# Patient Record
Sex: Female | Born: 1935 | Race: White | Hispanic: No | Marital: Married
Health system: Southern US, Community
[De-identification: ages and names within clinical notes are randomized; demographics above are authoritative.]

---

## 1998-12-01 ENCOUNTER — Ambulatory Visit (HOSPITAL_COMMUNITY): Admission: RE | Admit: 1998-12-01 | Discharge: 1998-12-01 | Payer: Self-pay | Admitting: Internal Medicine

## 1999-12-06 ENCOUNTER — Encounter: Payer: Self-pay | Admitting: Internal Medicine

## 1999-12-06 ENCOUNTER — Ambulatory Visit (HOSPITAL_COMMUNITY): Admission: RE | Admit: 1999-12-06 | Discharge: 1999-12-06 | Payer: Self-pay | Admitting: Internal Medicine

## 2001-01-22 ENCOUNTER — Encounter: Payer: Self-pay | Admitting: Oncology

## 2001-01-22 ENCOUNTER — Ambulatory Visit (HOSPITAL_COMMUNITY): Admission: RE | Admit: 2001-01-22 | Discharge: 2001-01-22 | Payer: Self-pay | Admitting: Internal Medicine

## 2004-11-08 ENCOUNTER — Inpatient Hospital Stay (HOSPITAL_COMMUNITY): Admission: RE | Admit: 2004-11-08 | Discharge: 2004-11-10 | Payer: Self-pay | Admitting: Orthopedic Surgery

## 2004-11-29 ENCOUNTER — Encounter: Payer: Self-pay | Admitting: Orthopedic Surgery

## 2004-12-19 ENCOUNTER — Encounter: Payer: Self-pay | Admitting: Orthopedic Surgery

## 2005-01-19 ENCOUNTER — Encounter: Payer: Self-pay | Admitting: Orthopedic Surgery

## 2005-06-08 ENCOUNTER — Ambulatory Visit: Payer: Self-pay | Admitting: Internal Medicine

## 2005-08-10 ENCOUNTER — Ambulatory Visit: Payer: Self-pay

## 2005-08-19 ENCOUNTER — Ambulatory Visit: Payer: Self-pay | Admitting: Internal Medicine

## 2005-11-15 ENCOUNTER — Encounter: Payer: Self-pay | Admitting: Orthopedic Surgery

## 2005-11-18 ENCOUNTER — Encounter: Payer: Self-pay | Admitting: Orthopedic Surgery

## 2006-04-05 ENCOUNTER — Inpatient Hospital Stay (HOSPITAL_COMMUNITY): Admission: RE | Admit: 2006-04-05 | Discharge: 2006-04-09 | Payer: Self-pay | Admitting: Orthopedic Surgery

## 2006-04-25 ENCOUNTER — Encounter: Payer: Self-pay | Admitting: Orthopedic Surgery

## 2006-05-19 ENCOUNTER — Encounter: Payer: Self-pay | Admitting: Orthopedic Surgery

## 2006-07-25 ENCOUNTER — Ambulatory Visit: Payer: Self-pay | Admitting: Internal Medicine

## 2007-04-24 ENCOUNTER — Ambulatory Visit: Payer: Self-pay | Admitting: Urology

## 2007-05-15 ENCOUNTER — Ambulatory Visit: Payer: Self-pay | Admitting: Urology

## 2007-10-17 ENCOUNTER — Ambulatory Visit: Payer: Self-pay | Admitting: Internal Medicine

## 2007-11-03 ENCOUNTER — Inpatient Hospital Stay: Payer: Self-pay | Admitting: Internal Medicine

## 2007-11-03 ENCOUNTER — Other Ambulatory Visit: Payer: Self-pay

## 2008-07-22 ENCOUNTER — Ambulatory Visit: Payer: Self-pay | Admitting: Ophthalmology

## 2008-07-22 ENCOUNTER — Other Ambulatory Visit: Payer: Self-pay

## 2008-07-26 IMAGING — CT CT HEAD WITHOUT CONTRAST
2 series · 16 of 30 positions shown, 20 images · non-contrast
Comparison: none

REASON FOR EXAM: severe headache
COMMENTS:

[Series 2: without · axial · non-contrast · 0.39mm/px · z∈[-170,-45]mm · 13 of 31 slices shown, 17 images]
[im 3/31  brain]
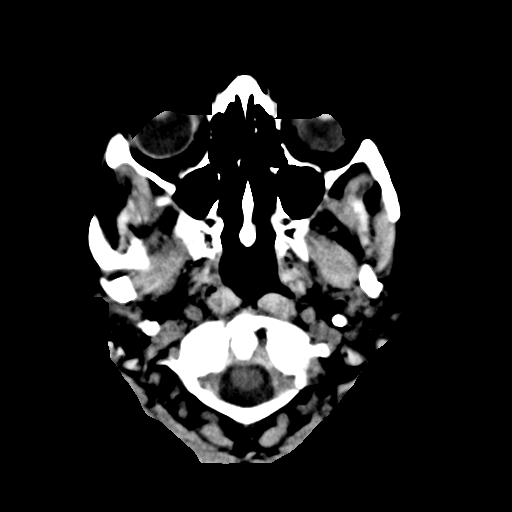
[im 3/31  bone]
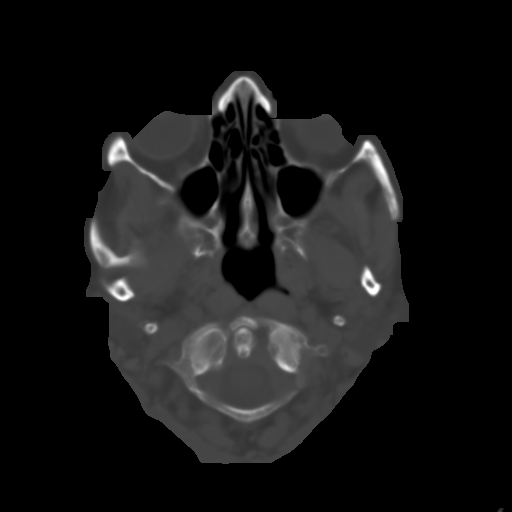
[im 5/31  brain]
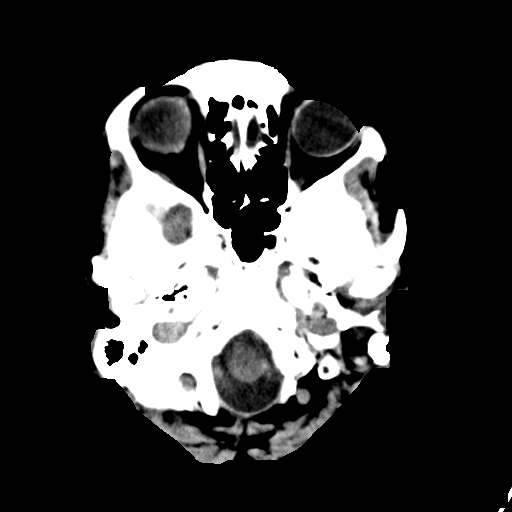
[im 7/31  brain]
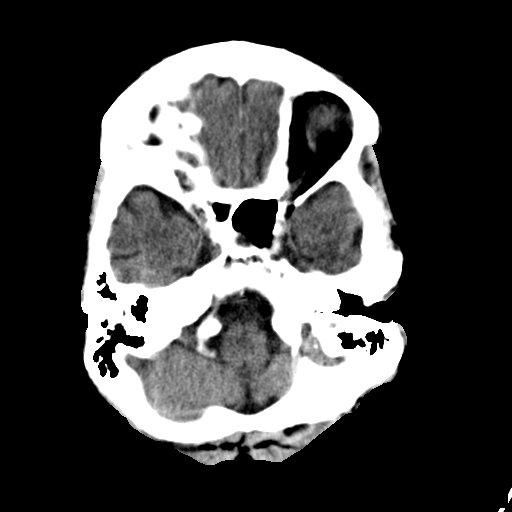
[im 9/31  brain]
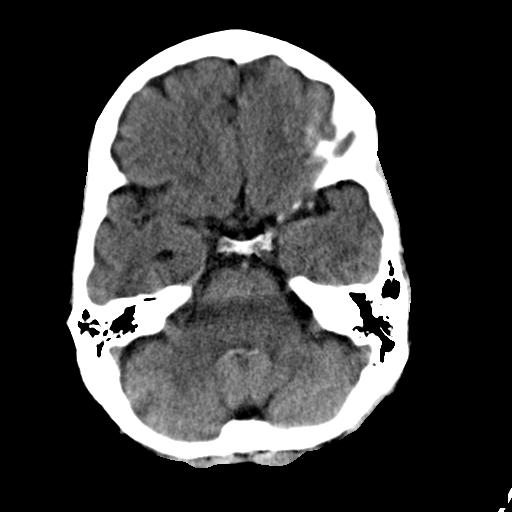
[im 11/31  brain]
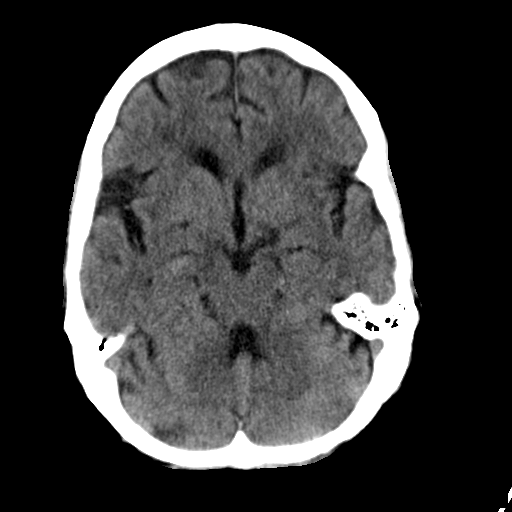
[im 11/31  bone]
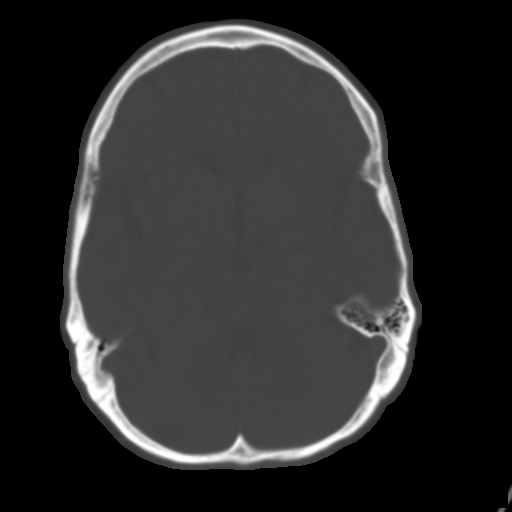
[im 13/31  brain]
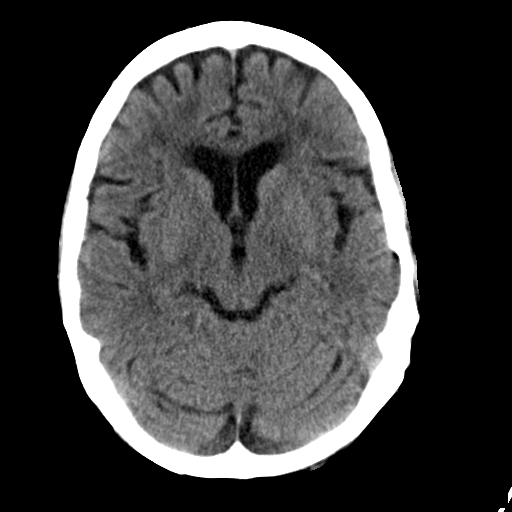
[im 16/31  brain]
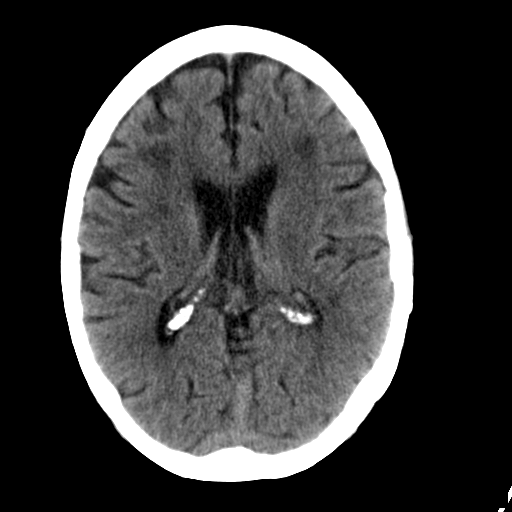
[im 18/31  brain]
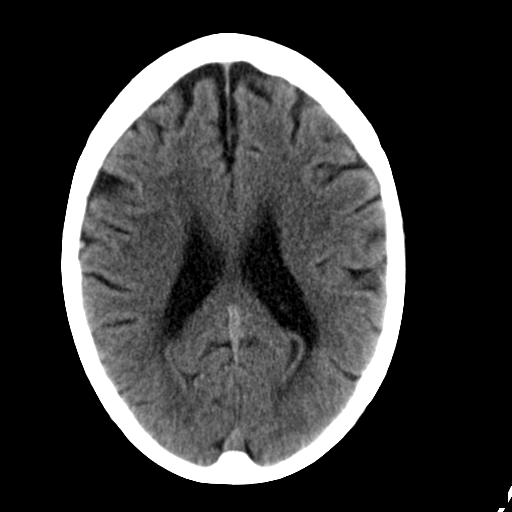
[im 20/31  brain]
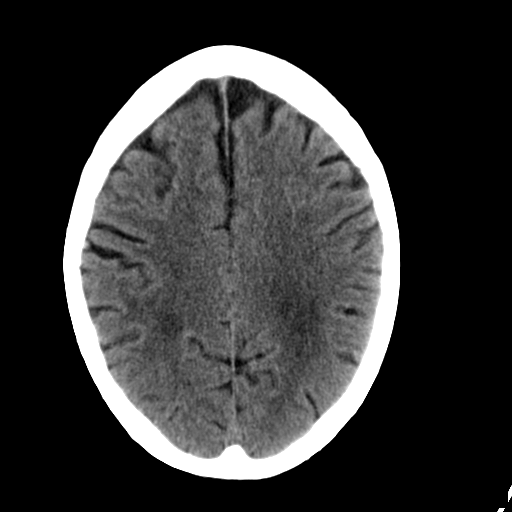
[im 20/31  bone]
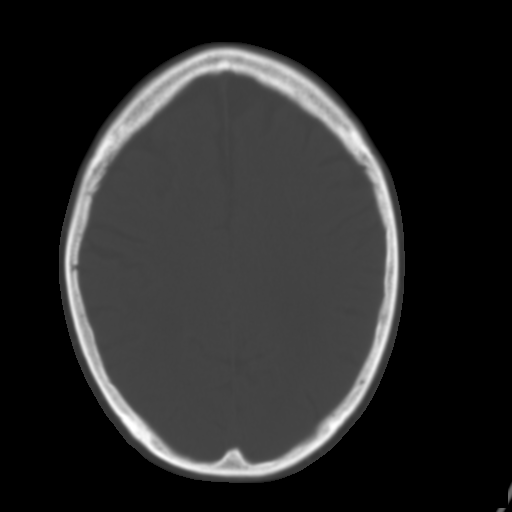
[im 22/31  brain]
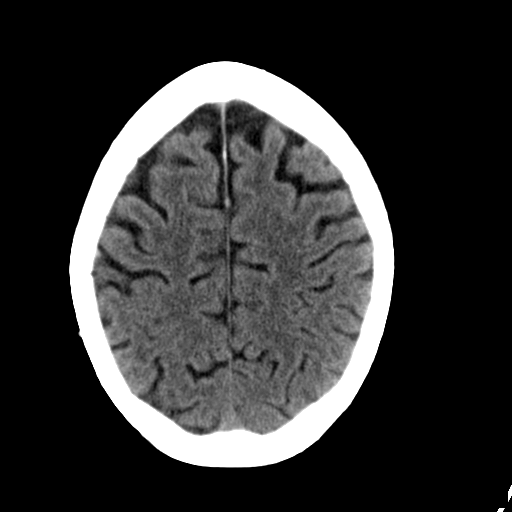
[im 24/31  brain]
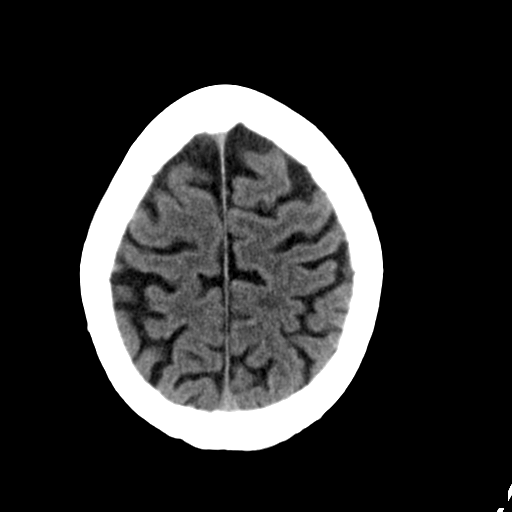
[im 26/31  brain]
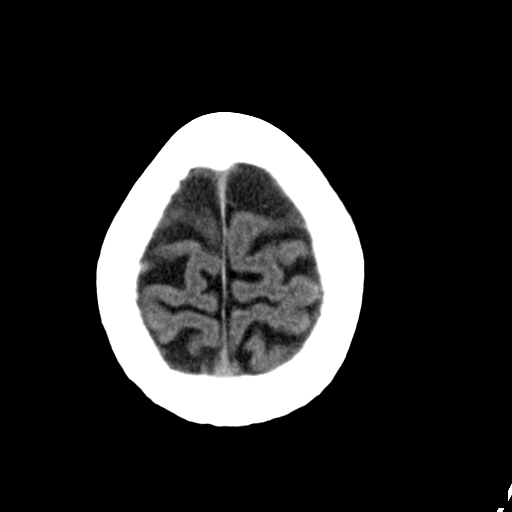
[im 28/31  brain]
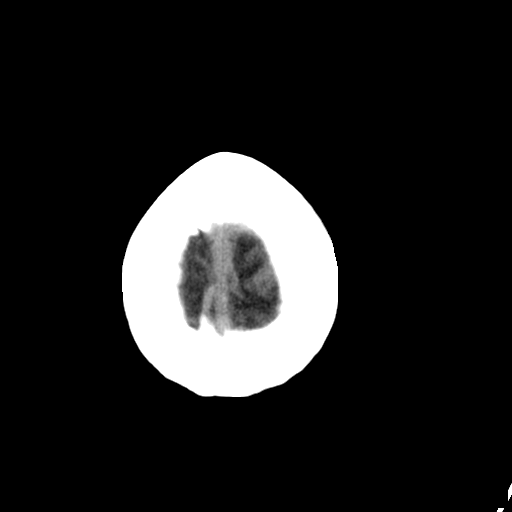
[im 28/31  bone]
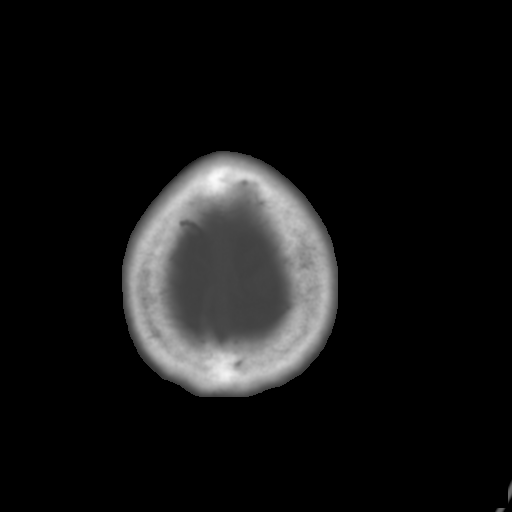

[Series 3: bone · axial · 0.39mm/px · z∈[-170,-130]mm · 3 of 31 slices shown]
[im 3/31  bone]
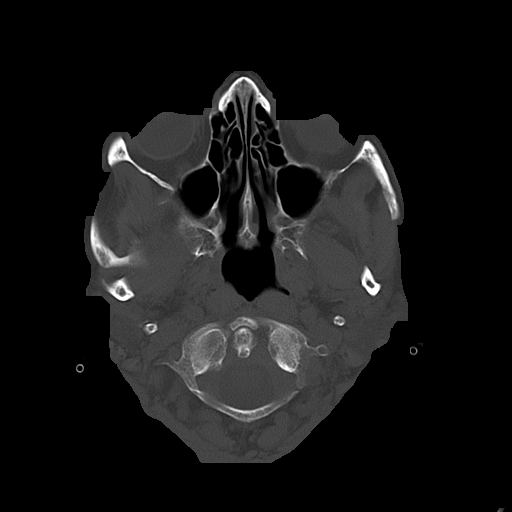
[im 7/31  bone]
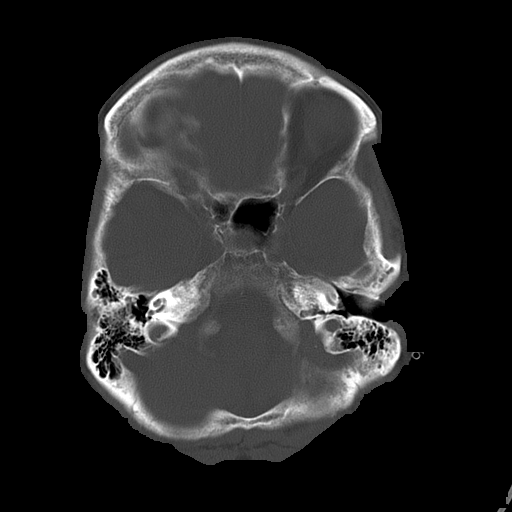
[im 11/31  bone]
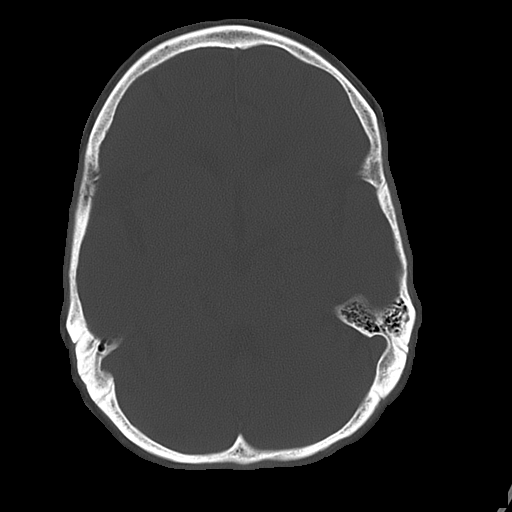

[16 of 30 positions shown; findings below may reference images not displayed]

PROCEDURE:     CT  - CT HEAD WITHOUT CONTRAST  - November 03, 2007  [DATE]

RESULT:     Noncontrast emergent CT of the brain is performed. The patient
has no prior exam for comparison. Images are reconstructed at 5 mm slice
thickness in the axial plane. There is some ill-defined low density within
the periventricular white matter which is nonspecific but suggestive of
chronic microvascular ischemic disease. There is no territorial infarct.
There is prominence of the ventricles and sulci consistent with atrophy
appropriate for the patient's age. The included sinuses and mastoids show
normal aeration although the frontal sinuses are aplastic. There is no skull
fracture.
IMPRESSION: 1. Changes of chronic microvascular ischemic disease. There is also
age-appropriate atrophy. There is no acute intracranial abnormality.

## 2008-07-28 ENCOUNTER — Ambulatory Visit: Payer: Self-pay | Admitting: Ophthalmology

## 2009-09-18 ENCOUNTER — Ambulatory Visit: Payer: Self-pay | Admitting: Oncology

## 2009-10-06 ENCOUNTER — Ambulatory Visit: Payer: Self-pay | Admitting: Internal Medicine

## 2009-10-15 ENCOUNTER — Ambulatory Visit: Payer: Self-pay | Admitting: Oncology

## 2009-10-19 ENCOUNTER — Ambulatory Visit: Payer: Self-pay | Admitting: Oncology

## 2009-11-18 ENCOUNTER — Ambulatory Visit: Payer: Self-pay | Admitting: Ophthalmology

## 2009-11-18 ENCOUNTER — Ambulatory Visit: Payer: Self-pay | Admitting: Oncology

## 2009-11-24 ENCOUNTER — Ambulatory Visit: Payer: Self-pay | Admitting: Ophthalmology

## 2010-07-14 IMAGING — CT CT CHEST-ABD W/ CM
1 of 2 series · 13 of 32 positions shown, 18 images · non-contrast
Comparison: none

REASON FOR EXAM: weight loss 783.21    HX breast CA  AE7.7   STAT READ
per office CALL  [phone]
COMMENTS:

[Series 2: soft tissue · axial · 0.59mm/px · z∈[-413,-63]mm · 13 of 80 slices shown, 18 images]
[im 5/80  mediastinal]
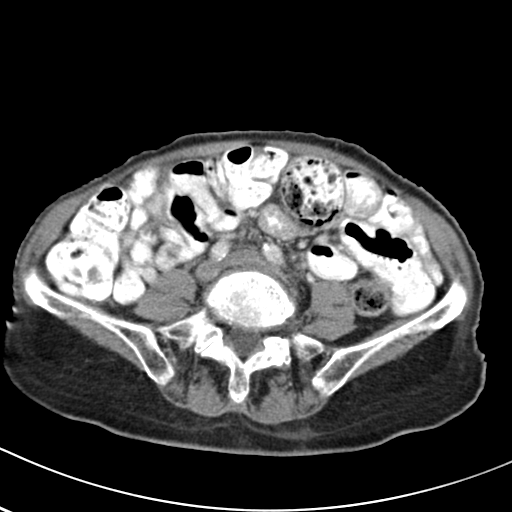
[im 5/80  bone]
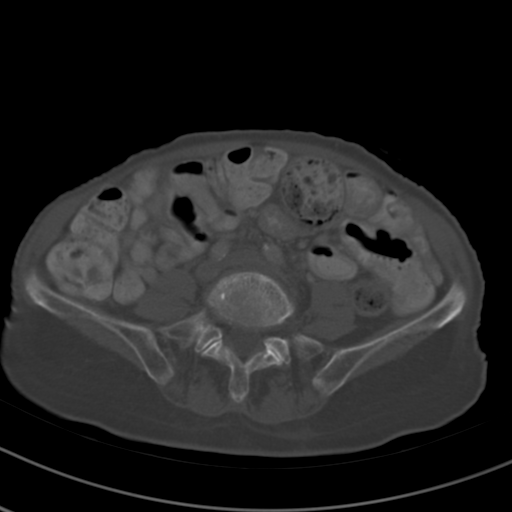
[im 15/80  mediastinal]
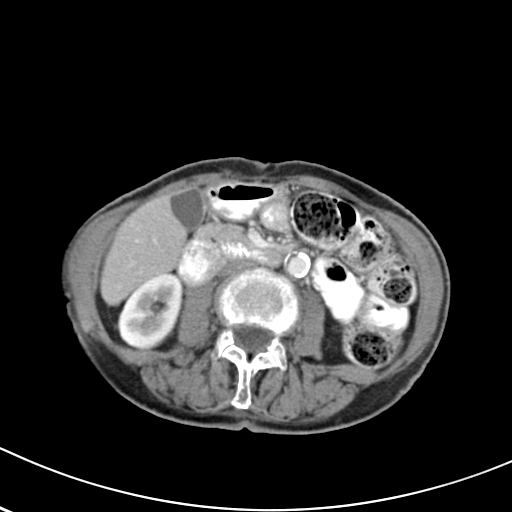
[im 20/80  mediastinal]
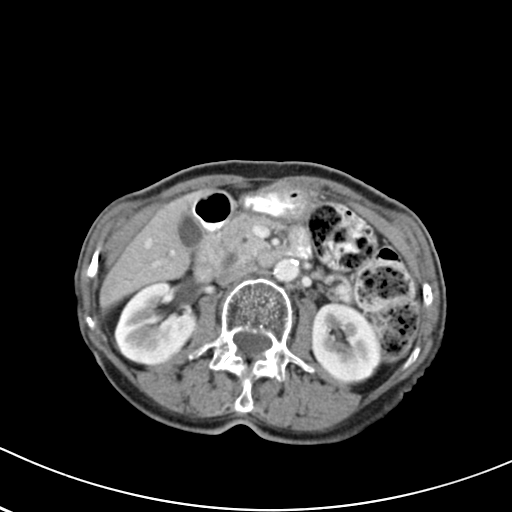
[im 27/80  mediastinal]
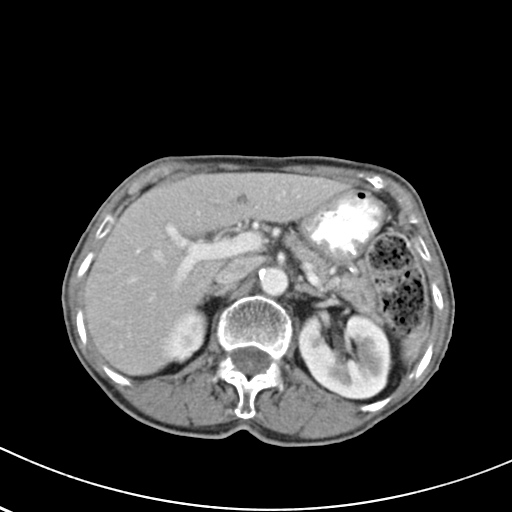
[im 30/80  mediastinal]
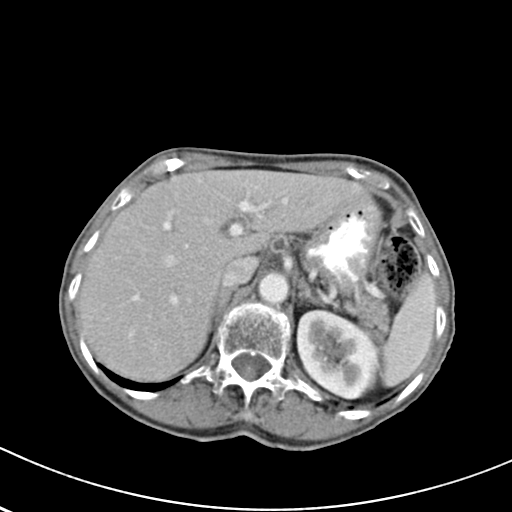
[im 39/80  mediastinal]
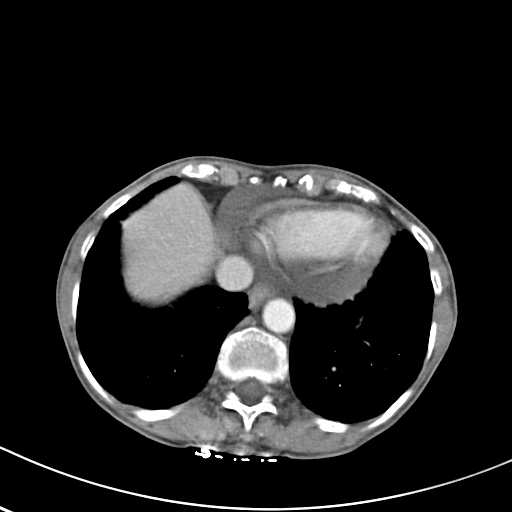
[im 40/80  mediastinal]
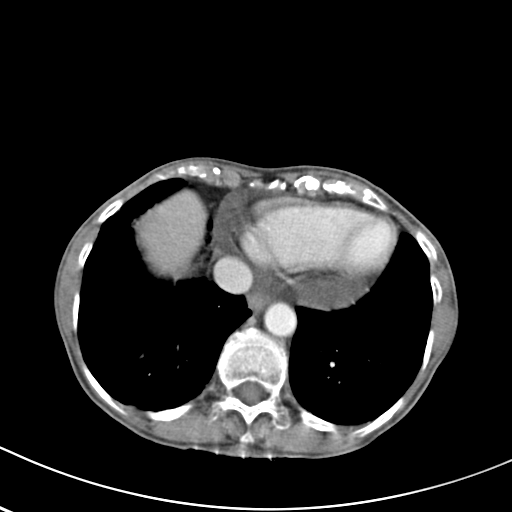
[im 50/80  mediastinal]
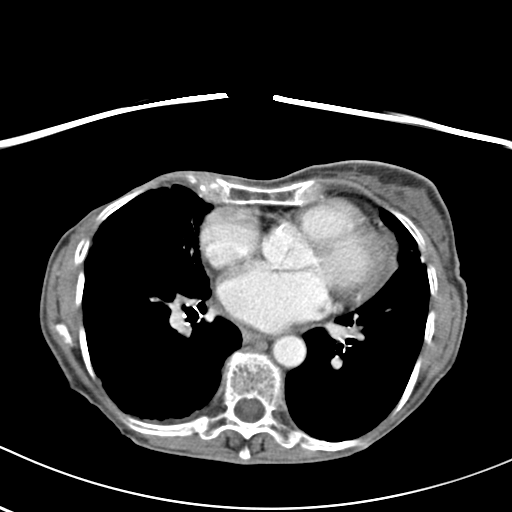
[im 53/80  mediastinal]
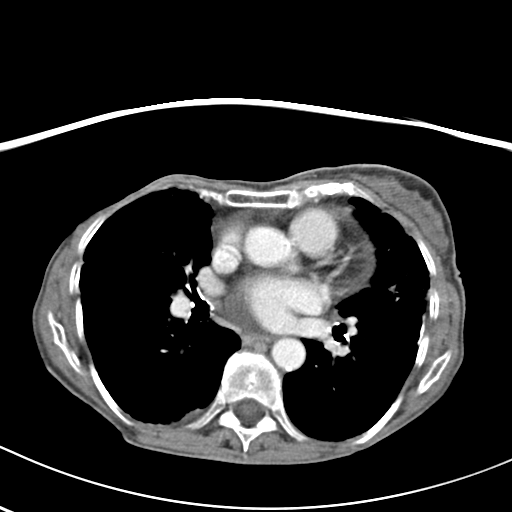
[im 53/80  bone]
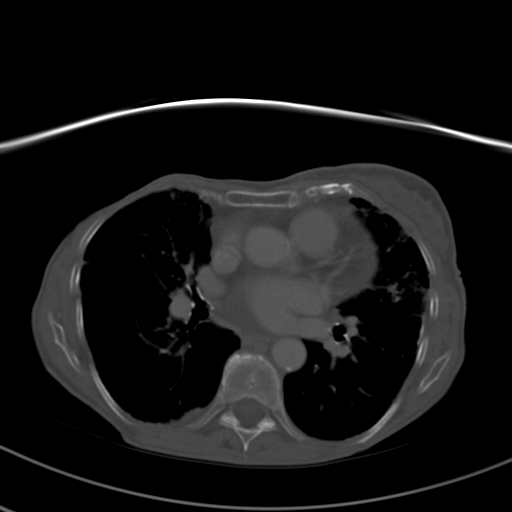
[im 60/80  mediastinal]
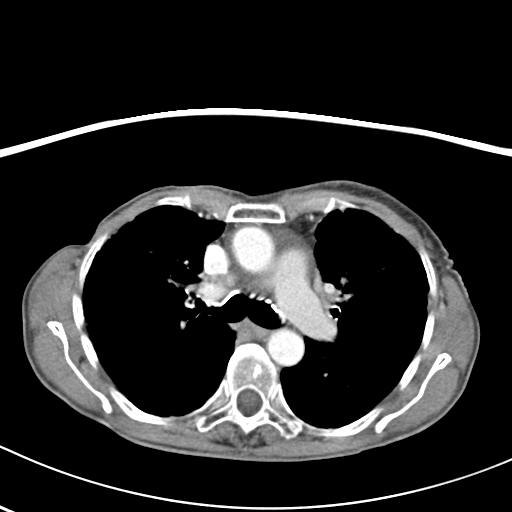
[im 60/80  lung]
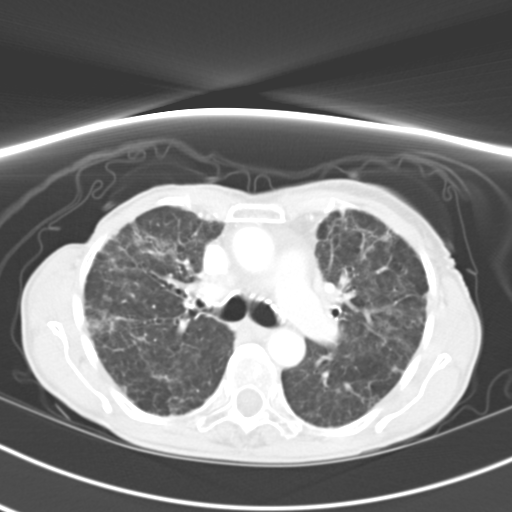
[im 65/80  mediastinal]
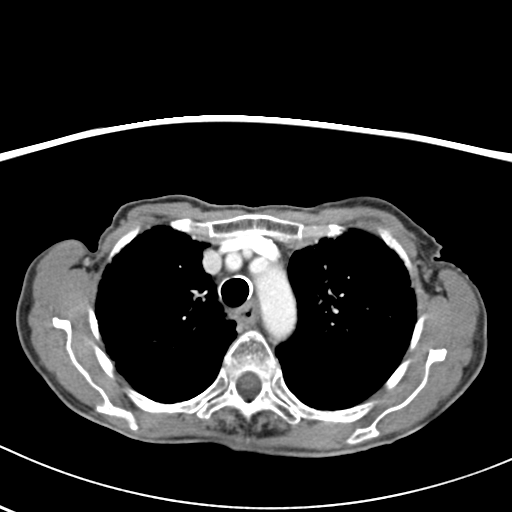
[im 65/80  lung]
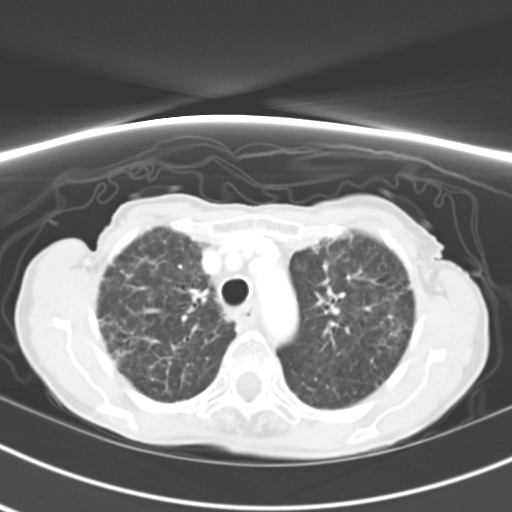
[im 70/80  lung]
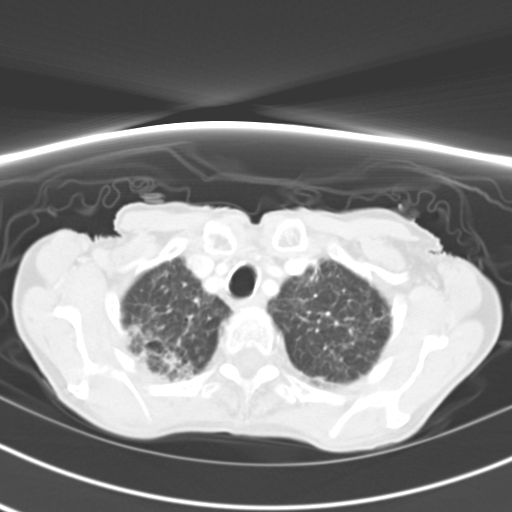
[im 75/80  mediastinal]
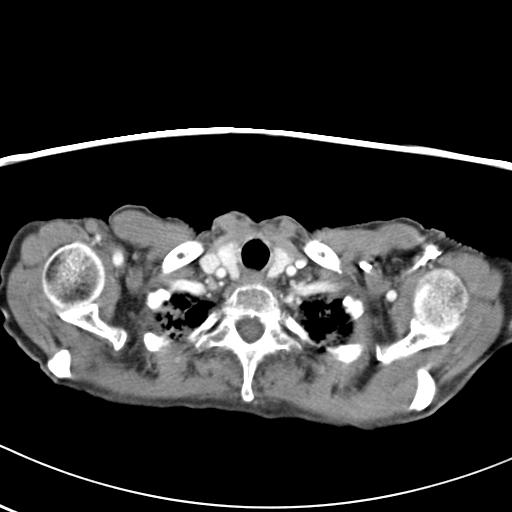
[im 75/80  lung]
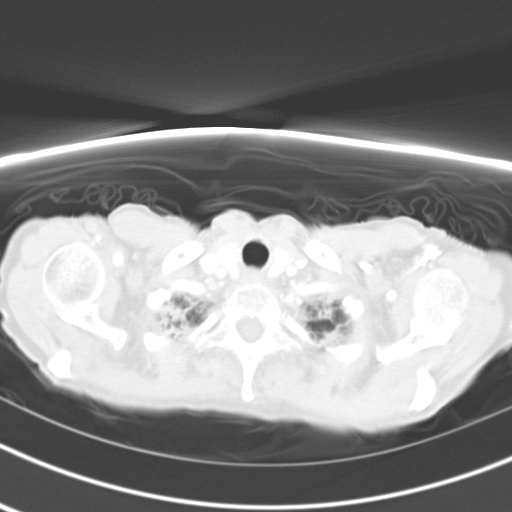

[13 of 32 positions shown; findings below may reference images not displayed]

PROCEDURE:     CT  - CT CHEST AND ABDOMEN W  - October 21, 2009 [DATE]

RESULT:     Comparison is made to prior study dated 11/03/07.

CHEST CT: Helical 5 mm sections were obtained from the thoracic inlet
through the lung bases status post intravenous administration of 70 ml
Isovue 300.

Evaluation of the mediastinum and hilar regions and structures demonstrates
no evidence of mediastinal nor hilar adenopathy nor masses. The previously
described subcarinal adenopathy has decreased in size and conspicuity. A
stable small pericardial effusion is appreciated. Evaluation of the lung
parenchyma demonstrates diffuse interstitial changes throughout both lungs.
Patchy areas of increased density are scattered throughout the right and
left hemithoraces. The previously described consolidative density within the
lateral right lung base as well as posteriorly has near completely resolved
in the interim with minimal areas of residual scarring. There is no evidence
of a pleural effusion.

ABDOMEN: Helical 5 mm sections were obtained from the lung bases through the
superior iliac crest status post intravenous administration of 70 ml Isovue
300 and oral contrast.

The liver, spleen, adrenals, pancreas and kidneys are unremarkable. There is
no CT evidence of bowel obstruction nor secondary signs reflecting
enteritis, colitis or diverticulitis. There is no evidence of abdominal
masses, free fluid, drainable loculated fluid collections or adenopathy nor
evidence of an abdominal aortic aneurysm.
IMPRESSION: 1. Near-complete resolution of the density within the right lower lobe.
2. Diffuse interstitial changes throughout both lungs with patchy areas of
increased focality as described above. These areas may represent areas of
focal scarring though infiltrates if of clinical concern cannot be excluded.
3. Residual small pericardial effusion within the base of the pericardium.
4. Interval decreased size and conspicuity of the subcarinal adenopathy.
5. No new masses or adenopathy is identified.
6. The chest wall was evaluated. There does not appear to be evidence of
masses in the region of the left breast.

## 2010-10-14 ENCOUNTER — Ambulatory Visit: Payer: Self-pay | Admitting: Internal Medicine

## 2011-04-11 ENCOUNTER — Ambulatory Visit: Payer: Self-pay

## 2011-04-11 ENCOUNTER — Ambulatory Visit: Payer: Self-pay | Admitting: Internal Medicine

## 2011-04-18 ENCOUNTER — Ambulatory Visit: Payer: Self-pay | Admitting: Unknown Physician Specialty

## 2011-04-19 ENCOUNTER — Ambulatory Visit: Payer: Self-pay | Admitting: Unknown Physician Specialty

## 2011-04-21 LAB — PATHOLOGY REPORT

## 2011-09-09 ENCOUNTER — Ambulatory Visit: Payer: Self-pay | Admitting: Internal Medicine

## 2011-11-01 ENCOUNTER — Ambulatory Visit: Payer: Self-pay | Admitting: Internal Medicine

## 2012-01-18 ENCOUNTER — Ambulatory Visit: Payer: Self-pay | Admitting: Rheumatology

## 2012-01-23 ENCOUNTER — Ambulatory Visit: Payer: Self-pay | Admitting: Neurology

## 2012-11-27 ENCOUNTER — Ambulatory Visit: Payer: Self-pay | Admitting: Internal Medicine

## 2013-04-10 ENCOUNTER — Inpatient Hospital Stay: Payer: Self-pay | Admitting: Internal Medicine

## 2013-04-10 LAB — CBC
HCT: 40 % (ref 35.0–47.0)
HGB: 13.6 g/dL (ref 12.0–16.0)
MCHC: 34 g/dL (ref 32.0–36.0)
MCV: 95 fL (ref 80–100)
RBC: 4.19 10*6/uL (ref 3.80–5.20)
WBC: 9.8 10*3/uL (ref 3.6–11.0)

## 2013-04-10 LAB — COMPREHENSIVE METABOLIC PANEL
Alkaline Phosphatase: 97 U/L (ref 50–136)
BUN: 12 mg/dL (ref 7–18)
Bilirubin,Total: 0.5 mg/dL (ref 0.2–1.0)
Calcium, Total: 8.8 mg/dL (ref 8.5–10.1)
Chloride: 95 mmol/L — ABNORMAL LOW (ref 98–107)
Co2: 31 mmol/L (ref 21–32)
Creatinine: 0.61 mg/dL (ref 0.60–1.30)
EGFR (Non-African Amer.): >60
SGOT(AST): 25 U/L (ref 15–37)
SGPT (ALT): 20 U/L (ref 12–78)
Sodium: 130 mmol/L — ABNORMAL LOW (ref 136–145)

## 2013-04-10 LAB — PRO B NATRIURETIC PEPTIDE: B-Type Natriuretic Peptide: 185 pg/mL (ref 0–450)

## 2013-04-10 LAB — TROPONIN I: Troponin-I: <0.02 ng/mL

## 2013-04-10 LAB — CK TOTAL AND CKMB (NOT AT ARMC): CK, Total: 73 U/L (ref 21–215)

## 2013-04-11 LAB — BASIC METABOLIC PANEL
Anion Gap: 4 — ABNORMAL LOW (ref 7–16)
BUN: 8 mg/dL (ref 7–18)
Calcium, Total: 8.3 mg/dL — ABNORMAL LOW (ref 8.5–10.1)
Chloride: 93 mmol/L — ABNORMAL LOW (ref 98–107)
Co2: 29 mmol/L (ref 21–32)
Creatinine: 0.53 mg/dL — ABNORMAL LOW (ref 0.60–1.30)
EGFR (African American): >60
Osmolality: 252 (ref 275–301)
Potassium: 3.6 mmol/L (ref 3.5–5.1)
Sodium: 126 mmol/L — ABNORMAL LOW (ref 136–145)

## 2013-04-11 LAB — CBC WITH DIFFERENTIAL/PLATELET
Basophil %: 0.3 %
Eosinophil #: 0 10*3/uL (ref 0.0–0.7)
Eosinophil %: 0.1 %
HCT: 36.9 % (ref 35.0–47.0)
HGB: 12.7 g/dL (ref 12.0–16.0)
Lymphocyte %: 7.8 %
MCH: 32.9 pg (ref 26.0–34.0)
MCV: 95 fL (ref 80–100)
Monocyte %: 8.2 %
Neutrophil #: 7.6 10*3/uL — ABNORMAL HIGH (ref 1.4–6.5)
Platelet: 192 10*3/uL (ref 150–440)
RBC: 3.87 10*6/uL (ref 3.80–5.20)

## 2013-04-11 LAB — MAGNESIUM: Magnesium: 1.3 mg/dL — ABNORMAL LOW

## 2013-04-12 LAB — URINALYSIS, COMPLETE
Bilirubin,UR: NEGATIVE
Glucose,UR: 50 mg/dL (ref 0–75)
Ph: 7 (ref 4.5–8.0)
Protein: NEGATIVE
Specific Gravity: 1.01 (ref 1.003–1.030)

## 2013-04-12 LAB — BASIC METABOLIC PANEL
Calcium, Total: 7.8 mg/dL — ABNORMAL LOW (ref 8.5–10.1)
Chloride: 93 mmol/L — ABNORMAL LOW (ref 98–107)
Co2: 31 mmol/L (ref 21–32)
EGFR (African American): >60
Osmolality: 259 (ref 275–301)
Potassium: 2.9 mmol/L — ABNORMAL LOW (ref 3.5–5.1)
Sodium: 130 mmol/L — ABNORMAL LOW (ref 136–145)

## 2013-04-12 LAB — POTASSIUM: Potassium: 3.8 mmol/L (ref 3.5–5.1)

## 2013-04-13 LAB — BASIC METABOLIC PANEL
BUN: 5 mg/dL — ABNORMAL LOW (ref 7–18)
Chloride: 91 mmol/L — ABNORMAL LOW (ref 98–107)
EGFR (African American): >60
EGFR (Non-African Amer.): >60
Glucose: 105 mg/dL — ABNORMAL HIGH (ref 65–99)
Osmolality: 257 (ref 275–301)

## 2013-04-13 LAB — URINE CULTURE

## 2013-04-14 LAB — BASIC METABOLIC PANEL
BUN: 7 mg/dL (ref 7–18)
Co2: 34 mmol/L — ABNORMAL HIGH (ref 21–32)
Sodium: 126 mmol/L — ABNORMAL LOW (ref 136–145)

## 2013-04-14 LAB — TSH: Thyroid Stimulating Horm: 0.928 u[IU]/mL

## 2013-04-15 LAB — BASIC METABOLIC PANEL
Anion Gap: 6 — ABNORMAL LOW (ref 7–16)
BUN: 11 mg/dL (ref 7–18)
Calcium, Total: 8.3 mg/dL — ABNORMAL LOW (ref 8.5–10.1)
Chloride: 85 mmol/L — ABNORMAL LOW (ref 98–107)
Creatinine: 0.26 mg/dL — ABNORMAL LOW (ref 0.60–1.30)
EGFR (Non-African Amer.): >60
Osmolality: 250 (ref 275–301)
Potassium: 3.7 mmol/L (ref 3.5–5.1)
Sodium: 124 mmol/L — ABNORMAL LOW (ref 136–145)

## 2013-04-15 LAB — PLATELET COUNT: Platelet: 208 10*3/uL (ref 150–440)

## 2013-04-16 LAB — CBC WITH DIFFERENTIAL/PLATELET
Basophil %: 0 %
Eosinophil #: 0 10*3/uL (ref 0.0–0.7)
Eosinophil %: 0 %
HCT: 34.6 % — ABNORMAL LOW (ref 35.0–47.0)
HGB: 12.3 g/dL (ref 12.0–16.0)
Lymphocyte #: 0.3 10*3/uL — ABNORMAL LOW (ref 1.0–3.6)
MCH: 33.1 pg (ref 26.0–34.0)
Monocyte #: 0.3 x10 3/mm (ref 0.2–0.9)
Monocyte %: 5.8 %
Neutrophil #: 5.2 10*3/uL (ref 1.4–6.5)
RDW: 12 % (ref 11.5–14.5)

## 2013-04-16 LAB — BASIC METABOLIC PANEL
Anion Gap: 4 — ABNORMAL LOW (ref 7–16)
Calcium, Total: 8.4 mg/dL — ABNORMAL LOW (ref 8.5–10.1)
Chloride: 89 mmol/L — ABNORMAL LOW (ref 98–107)
Co2: 34 mmol/L — ABNORMAL HIGH (ref 21–32)
Creatinine: 0.32 mg/dL — ABNORMAL LOW (ref 0.60–1.30)
EGFR (African American): >60
Glucose: 123 mg/dL — ABNORMAL HIGH (ref 65–99)
Osmolality: 257 (ref 275–301)
Potassium: 3.5 mmol/L (ref 3.5–5.1)
Sodium: 127 mmol/L — ABNORMAL LOW (ref 136–145)

## 2013-04-16 LAB — PATHOLOGY REPORT

## 2013-06-22 ENCOUNTER — Emergency Department: Payer: Self-pay | Admitting: Emergency Medicine

## 2013-06-22 LAB — CBC
HCT: 41.9 % (ref 35.0–47.0)
HGB: 14.2 g/dL (ref 12.0–16.0)
MCH: 32.3 pg (ref 26.0–34.0)
MCHC: 34 g/dL (ref 32.0–36.0)
MCV: 95 fL (ref 80–100)
Platelet: 225 10*3/uL (ref 150–440)
RBC: 4.42 10*6/uL (ref 3.80–5.20)
RDW: 14.7 % — ABNORMAL HIGH (ref 11.5–14.5)
WBC: 7.9 10*3/uL (ref 3.6–11.0)

## 2013-06-22 LAB — URINALYSIS, COMPLETE
Bilirubin,UR: NEGATIVE
Blood: NEGATIVE
Glucose,UR: NEGATIVE mg/dL (ref 0–75)
Nitrite: POSITIVE
Ph: 6 (ref 4.5–8.0)
Protein: 30
RBC,UR: 1 /HPF (ref 0–5)
Specific Gravity: 1.02 (ref 1.003–1.030)
Squamous Epithelial: <1
WBC UR: 56 /HPF (ref 0–5)

## 2013-06-22 LAB — BASIC METABOLIC PANEL
BUN: 19 mg/dL — ABNORMAL HIGH (ref 7–18)
Calcium, Total: 10.1 mg/dL (ref 8.5–10.1)
Co2: 27 mmol/L (ref 21–32)
EGFR (Non-African Amer.): 53 — ABNORMAL LOW
Glucose: 88 mg/dL (ref 65–99)
Sodium: 134 mmol/L — ABNORMAL LOW (ref 136–145)

## 2013-09-17 ENCOUNTER — Ambulatory Visit: Payer: Self-pay

## 2014-04-30 ENCOUNTER — Ambulatory Visit: Payer: Self-pay | Admitting: Internal Medicine

## 2014-08-27 ENCOUNTER — Telehealth: Payer: Self-pay | Admitting: *Deleted

## 2014-08-27 NOTE — Telephone Encounter (Signed)
error 

## 2014-12-27 DIAGNOSIS — J449 Chronic obstructive pulmonary disease, unspecified: Secondary | ICD-10-CM | POA: Diagnosis not present

## 2015-01-27 DIAGNOSIS — J449 Chronic obstructive pulmonary disease, unspecified: Secondary | ICD-10-CM | POA: Diagnosis not present

## 2015-03-09 DIAGNOSIS — D62 Acute posthemorrhagic anemia: Secondary | ICD-10-CM | POA: Diagnosis not present

## 2015-03-09 DIAGNOSIS — M810 Age-related osteoporosis without current pathological fracture: Secondary | ICD-10-CM | POA: Diagnosis not present

## 2015-03-09 DIAGNOSIS — S72002A Fracture of unspecified part of neck of left femur, initial encounter for closed fracture: Secondary | ICD-10-CM | POA: Diagnosis not present

## 2015-03-09 DIAGNOSIS — S0990XA Unspecified injury of head, initial encounter: Secondary | ICD-10-CM | POA: Diagnosis not present

## 2015-03-09 DIAGNOSIS — Z853 Personal history of malignant neoplasm of breast: Secondary | ICD-10-CM | POA: Diagnosis not present

## 2015-03-09 DIAGNOSIS — K219 Gastro-esophageal reflux disease without esophagitis: Secondary | ICD-10-CM | POA: Diagnosis not present

## 2015-03-09 DIAGNOSIS — J849 Interstitial pulmonary disease, unspecified: Secondary | ICD-10-CM | POA: Diagnosis not present

## 2015-03-09 DIAGNOSIS — R1312 Dysphagia, oropharyngeal phase: Secondary | ICD-10-CM | POA: Diagnosis not present

## 2015-03-09 DIAGNOSIS — R410 Disorientation, unspecified: Secondary | ICD-10-CM | POA: Diagnosis not present

## 2015-03-09 DIAGNOSIS — S72009A Fracture of unspecified part of neck of unspecified femur, initial encounter for closed fracture: Secondary | ICD-10-CM | POA: Diagnosis not present

## 2015-03-09 DIAGNOSIS — Z9011 Acquired absence of right breast and nipple: Secondary | ICD-10-CM | POA: Diagnosis not present

## 2015-03-09 DIAGNOSIS — J9610 Chronic respiratory failure, unspecified whether with hypoxia or hypercapnia: Secondary | ICD-10-CM | POA: Diagnosis not present

## 2015-03-09 DIAGNOSIS — R279 Unspecified lack of coordination: Secondary | ICD-10-CM | POA: Diagnosis not present

## 2015-03-09 DIAGNOSIS — J984 Other disorders of lung: Secondary | ICD-10-CM | POA: Diagnosis not present

## 2015-03-09 DIAGNOSIS — R4182 Altered mental status, unspecified: Secondary | ICD-10-CM | POA: Diagnosis not present

## 2015-03-09 DIAGNOSIS — Z9841 Cataract extraction status, right eye: Secondary | ICD-10-CM | POA: Diagnosis not present

## 2015-03-09 DIAGNOSIS — R40241 Glasgow coma scale score 13-15: Secondary | ICD-10-CM | POA: Diagnosis not present

## 2015-03-09 DIAGNOSIS — N390 Urinary tract infection, site not specified: Secondary | ICD-10-CM | POA: Diagnosis not present

## 2015-03-09 DIAGNOSIS — M1990 Unspecified osteoarthritis, unspecified site: Secondary | ICD-10-CM | POA: Diagnosis not present

## 2015-03-09 DIAGNOSIS — I10 Essential (primary) hypertension: Secondary | ICD-10-CM | POA: Diagnosis not present

## 2015-03-09 DIAGNOSIS — R262 Difficulty in walking, not elsewhere classified: Secondary | ICD-10-CM | POA: Diagnosis not present

## 2015-03-09 DIAGNOSIS — R41841 Cognitive communication deficit: Secondary | ICD-10-CM | POA: Diagnosis not present

## 2015-03-09 DIAGNOSIS — W19XXXD Unspecified fall, subsequent encounter: Secondary | ICD-10-CM | POA: Diagnosis not present

## 2015-03-09 DIAGNOSIS — J439 Emphysema, unspecified: Secondary | ICD-10-CM | POA: Diagnosis not present

## 2015-03-09 DIAGNOSIS — S72002D Fracture of unspecified part of neck of left femur, subsequent encounter for closed fracture with routine healing: Secondary | ICD-10-CM | POA: Diagnosis not present

## 2015-03-09 DIAGNOSIS — Z9221 Personal history of antineoplastic chemotherapy: Secondary | ICD-10-CM | POA: Diagnosis not present

## 2015-03-09 DIAGNOSIS — M25552 Pain in left hip: Secondary | ICD-10-CM | POA: Diagnosis not present

## 2015-03-09 DIAGNOSIS — R52 Pain, unspecified: Secondary | ICD-10-CM | POA: Diagnosis not present

## 2015-03-09 DIAGNOSIS — K5900 Constipation, unspecified: Secondary | ICD-10-CM | POA: Diagnosis not present

## 2015-03-09 DIAGNOSIS — W19XXXA Unspecified fall, initial encounter: Secondary | ICD-10-CM | POA: Diagnosis not present

## 2015-03-09 DIAGNOSIS — F0390 Unspecified dementia without behavioral disturbance: Secondary | ICD-10-CM | POA: Diagnosis not present

## 2015-03-09 DIAGNOSIS — S72142A Displaced intertrochanteric fracture of left femur, initial encounter for closed fracture: Secondary | ICD-10-CM | POA: Diagnosis not present

## 2015-03-09 DIAGNOSIS — F419 Anxiety disorder, unspecified: Secondary | ICD-10-CM | POA: Diagnosis not present

## 2015-03-09 DIAGNOSIS — Z9842 Cataract extraction status, left eye: Secondary | ICD-10-CM | POA: Diagnosis not present

## 2015-03-09 DIAGNOSIS — R5381 Other malaise: Secondary | ICD-10-CM | POA: Diagnosis not present

## 2015-03-09 DIAGNOSIS — F339 Major depressive disorder, recurrent, unspecified: Secondary | ICD-10-CM | POA: Diagnosis not present

## 2015-03-09 DIAGNOSIS — Z66 Do not resuscitate: Secondary | ICD-10-CM | POA: Diagnosis not present

## 2015-03-09 DIAGNOSIS — R0902 Hypoxemia: Secondary | ICD-10-CM | POA: Diagnosis not present

## 2015-03-09 DIAGNOSIS — Z96651 Presence of right artificial knee joint: Secondary | ICD-10-CM | POA: Diagnosis not present

## 2015-03-09 DIAGNOSIS — M6281 Muscle weakness (generalized): Secondary | ICD-10-CM | POA: Diagnosis not present

## 2015-03-16 DIAGNOSIS — R5381 Other malaise: Secondary | ICD-10-CM | POA: Diagnosis not present

## 2015-03-16 DIAGNOSIS — R41841 Cognitive communication deficit: Secondary | ICD-10-CM | POA: Diagnosis not present

## 2015-03-16 DIAGNOSIS — J439 Emphysema, unspecified: Secondary | ICD-10-CM | POA: Diagnosis not present

## 2015-03-16 DIAGNOSIS — K5900 Constipation, unspecified: Secondary | ICD-10-CM | POA: Diagnosis not present

## 2015-03-16 DIAGNOSIS — F0390 Unspecified dementia without behavioral disturbance: Secondary | ICD-10-CM | POA: Diagnosis not present

## 2015-03-16 DIAGNOSIS — M1990 Unspecified osteoarthritis, unspecified site: Secondary | ICD-10-CM | POA: Diagnosis not present

## 2015-03-16 DIAGNOSIS — F339 Major depressive disorder, recurrent, unspecified: Secondary | ICD-10-CM | POA: Diagnosis not present

## 2015-03-16 DIAGNOSIS — R262 Difficulty in walking, not elsewhere classified: Secondary | ICD-10-CM | POA: Diagnosis not present

## 2015-03-16 DIAGNOSIS — S72009A Fracture of unspecified part of neck of unspecified femur, initial encounter for closed fracture: Secondary | ICD-10-CM | POA: Diagnosis not present

## 2015-03-16 DIAGNOSIS — M6281 Muscle weakness (generalized): Secondary | ICD-10-CM | POA: Diagnosis not present

## 2015-03-16 DIAGNOSIS — Z96642 Presence of left artificial hip joint: Secondary | ICD-10-CM | POA: Diagnosis not present

## 2015-03-16 DIAGNOSIS — K219 Gastro-esophageal reflux disease without esophagitis: Secondary | ICD-10-CM | POA: Diagnosis not present

## 2015-03-16 DIAGNOSIS — R52 Pain, unspecified: Secondary | ICD-10-CM | POA: Diagnosis not present

## 2015-03-16 DIAGNOSIS — W19XXXD Unspecified fall, subsequent encounter: Secondary | ICD-10-CM | POA: Diagnosis not present

## 2015-03-16 DIAGNOSIS — F419 Anxiety disorder, unspecified: Secondary | ICD-10-CM | POA: Diagnosis not present

## 2015-03-16 DIAGNOSIS — R1312 Dysphagia, oropharyngeal phase: Secondary | ICD-10-CM | POA: Diagnosis not present

## 2015-03-16 DIAGNOSIS — S72002D Fracture of unspecified part of neck of left femur, subsequent encounter for closed fracture with routine healing: Secondary | ICD-10-CM | POA: Diagnosis not present

## 2015-03-16 DIAGNOSIS — M25552 Pain in left hip: Secondary | ICD-10-CM | POA: Diagnosis not present

## 2015-03-16 DIAGNOSIS — R279 Unspecified lack of coordination: Secondary | ICD-10-CM | POA: Diagnosis not present

## 2015-03-16 DIAGNOSIS — I10 Essential (primary) hypertension: Secondary | ICD-10-CM | POA: Diagnosis not present

## 2015-03-16 DIAGNOSIS — M810 Age-related osteoporosis without current pathological fracture: Secondary | ICD-10-CM | POA: Diagnosis not present

## 2015-03-17 DIAGNOSIS — F0390 Unspecified dementia without behavioral disturbance: Secondary | ICD-10-CM | POA: Diagnosis not present

## 2015-03-17 DIAGNOSIS — I10 Essential (primary) hypertension: Secondary | ICD-10-CM | POA: Diagnosis not present

## 2015-03-17 DIAGNOSIS — Z96642 Presence of left artificial hip joint: Secondary | ICD-10-CM | POA: Diagnosis not present

## 2015-03-20 DIAGNOSIS — Z96642 Presence of left artificial hip joint: Secondary | ICD-10-CM | POA: Diagnosis not present

## 2015-03-20 DIAGNOSIS — F0390 Unspecified dementia without behavioral disturbance: Secondary | ICD-10-CM | POA: Diagnosis not present

## 2015-03-20 DIAGNOSIS — I10 Essential (primary) hypertension: Secondary | ICD-10-CM | POA: Diagnosis not present

## 2015-03-27 DIAGNOSIS — F0390 Unspecified dementia without behavioral disturbance: Secondary | ICD-10-CM | POA: Diagnosis not present

## 2015-03-27 DIAGNOSIS — M25552 Pain in left hip: Secondary | ICD-10-CM | POA: Diagnosis not present

## 2015-03-27 DIAGNOSIS — I10 Essential (primary) hypertension: Secondary | ICD-10-CM | POA: Diagnosis not present

## 2015-03-27 DIAGNOSIS — Z96642 Presence of left artificial hip joint: Secondary | ICD-10-CM | POA: Diagnosis not present

## 2015-04-03 DIAGNOSIS — Z96642 Presence of left artificial hip joint: Secondary | ICD-10-CM | POA: Diagnosis not present

## 2015-04-03 DIAGNOSIS — I10 Essential (primary) hypertension: Secondary | ICD-10-CM | POA: Diagnosis not present

## 2015-04-03 DIAGNOSIS — F0390 Unspecified dementia without behavioral disturbance: Secondary | ICD-10-CM | POA: Diagnosis not present

## 2015-04-07 DIAGNOSIS — Z96642 Presence of left artificial hip joint: Secondary | ICD-10-CM | POA: Diagnosis not present

## 2015-04-07 DIAGNOSIS — I10 Essential (primary) hypertension: Secondary | ICD-10-CM | POA: Diagnosis not present

## 2015-04-07 DIAGNOSIS — F0390 Unspecified dementia without behavioral disturbance: Secondary | ICD-10-CM | POA: Diagnosis not present

## 2015-04-10 NOTE — Consult Note (Signed)
PATIENT NAME:  Teresa Choi, Teresa Choi MR#:  [id number] DATE OF BIRTH:  01-10-1925  DATE OF CONSULTATION:  04/11/2013  CONSULTING PHYSICIAN:  Leitha SchullerMichael J. Rilei Kravitz, MD  REASON FOR CONSULTATION: Severe back pain.   HISTORY OF PRESENT ILLNESS: The patient is an 81 year old who suffered a fall at home a few days prior to admission. She has a history of prior T11 compression fracture. She had kyphoplasty done previously with excellent results. She had x-rays that showed T5 fracture that appeared to be healed and compression at T1. An MRI was ordered and this confirmed that this was a somewhat acute fracture at the T12 level with 60% height loss on the MRI. On examination, she is found to have no clonus. She is not having radicular symptoms. She is having intense back pain and pain with any attempted motion. She is able to flex and extend the toes and had sensation in the feet.   CLINICAL IMPRESSION: T12 compression subacute fracture with severe symptoms, unable to ambulate.   RECOMMENDATION: That she can be medically stabilized for plan for kyphoplasty. The risks, benefits, and possible complications were discussed. She has had one previously and understands the procedure.    ____________________________ Leitha SchullerMichael J. Yoseline Andersson, MD mjm:cc D: 04/14/2013 21:59:44 ET Choi: 04/14/2013 22:12:27 ET JOB#: [id number]  cc: Leitha SchullerMichael J. Dorean Hiebert, MD, <Dictator> Leitha SchullerMICHAEL J Donnetta Gillin MD ELECTRONICALLY SIGNED 04/15/2013 7:28

## 2015-04-10 NOTE — H&P (Signed)
PATIENT NAME:  Teresa Choi, Teresa Choi MR#:  [id number] DATE OF BIRTH:  February 01, 1925  DATE OF ADMISSION:  04/10/2013  PRIMARY CARE PHYSICIAN: Beverely Risen, MD  REFERRING PHYSICIAN: Daryel November, MD   CHIEF COMPLAINT: Fall today.   HISTORY OF PRESENT ILLNESS: The patient is an 81 year old Caucasian female with a history of pulmonary fibrosis, arthritis, GERD, pneumonia a long time ago and migraines who presented to the ED with fall today. The patient is alert, awake, and oriented, in no acute distress. The patient said she fell by accident at home today.  She had a back injury and back pain after the fall so she was sent to the ED for further evaluation. In the ED, she was noted to have hypoxia with O2 saturation in 70s to 80s. The patient denies any cough, sputum, shortness of breath or wheezing. She has a long history of pulmonary fibrosis, but is not on any home oxygen.  The patient's chest x-ray showed interstitial lung disease and questionable pneumonia.  PAST MEDICAL HISTORY:  Pulmonary fibrosis, arthritis, GERD, pneumonia and migraines.  SOCIAL HISTORY: No smoking or drinking or illicit drugs.   FAMILY HISTORY: Breast cancer.   PAST SURGICAL HISTORY: Right knee replacement and appendectomy.   ALLERGIES: AMOXICILLIN, AUGMENTIN, SULFA DRUGS.   HOME MEDICATIONS: 1.  Citalopram 20 mg p.o. daily. 2.  Carbidopa/levodopa 25 mg/100 mg tablets 1 tablet b.i.d.  3. Alprazolam 0.5 mg 1/2 to 1 tablet once a day.   REVIEW OF SYSTEMS: CONSTITUTIONAL: The patient denies any fever or chills. No headache or dizziness, but has generalized weakness.  EYES: No double vision or blurred vision, but has history of a cataract status post surgery.  ENT: No postnasal drip. No slurred speech or dysphagia. No epistaxis.  CARDIOVASCULAR: No chest pain, palpitation, orthopnea, or nocturnal dyspnea. No leg edema.  PULMONARY: No cough, sputum, shortness of breath or hemoptysis.  GASTROINTESTINAL: No abdominal pain,  nausea, vomiting or diarrhea. No melena or bloody stool.  SKIN: No rash or jaundice.  GENITOURINARY:  No dysuria, hematuria or incontinence.  NEUROLOGIC: No syncope, loss of consciousness or seizure.  MUSCULOSKELETAL: No leg edema, but has back pain after fall.   PHYSICAL EXAMINATION: VITAL SIGNS: Temperature 97.6, blood pressure 160/63, pulse 98 and O2 saturation 70% in room air.  GENERAL: The patient is alert, awake and oriented, in no acute distress.  HEENT: Pupils are round, equal and reactive to light and accommodation.  NECK: Supple. No JVD or carotid bruits. No lymphadenopathy. No thyromegaly. Moist oral mucosa.  Clear oropharynx. HEART:  S1 and S2 regular rate and rhythm. No murmurs or gallops.  LUNGS:  Bilateral air entry. Bilateral crackles, but no wheezing, no rales and no use of accessory muscles to breathe.  ABDOMEN: Soft. No distention or tenderness. No organomegaly. Bowel sounds present.  EXTREMITIES: No edema, clubbing or cyanosis. No calf tenderness. Strong bilateral pedal pulses.  SKIN: No rash or jaundice, but has dry skin.  NEUROLOGY: Alert and oriented x 3. No focal deficit. Power 5/5. Sensation intact.   LABORATORY AND DIAGNOSTICS: Chest x-ray showed diffuse prominent interstitial marking likely secondary to chronic interstitial lung disease.  There may be some slight increasing density at the right lung base. Correlate for developing pneumonia.   Lumbar spine x-ray showed interval T11 kyphoplasty.   BNP 185. CK 73 and CK-MB 0.8. CBC normal. Glucose 136, BUN 12, creatinine 0.61, sodium 130, potassium 3.8, chloride 95 and bicarb 31. Troponin less than 0.02.   EKG shows sinus rhythm  with first-degree AV block at 83 bpm.   IMPRESSION:  1.  Hypoxia.  2.  Questionable pneumonia.  3.  History of pulmonary fibrosis.  4.  Fall.  5.  Weakness.  6.  Back pain.  7.  Hyponatremia.  PLAN OF TREATMENT:   1.  The patient will be admitted to the medical floor. We will  continue oxygen by nasal cannula and start Levaquin and meropenem to cover questionable pneumonia and follow up CBC. Will give DuoNeb.  The patient may need home oxygen.  2.  For hyponatremia, will give normal saline intravenous and follow up BMP.  3.  We start fall precautions and aspiration precautions. We will get a PT evaluation.  4.  Pain control.  5.  Continue the patient's home hypertension medication, diltiazem. 6.  Gastroenterology and deep vein thrombosis prophylaxis.  I discussed the patient's condition and the plan of treatment with the patient. Also discussed the patient's code status. The patient wants DO NOT RESUSCITATE.   TIME SPENT: About 63 minutes. ____________________________ Shaune PollackQing Ramandeep Arington, MD qc:sb D: 04/10/2013 14:04:57 ET T: 04/10/2013 14:19:01 ET JOB#: [id number]  cc: Shaune PollackQing Veleta Yamamoto, MD, <Dictator> Shaune PollackQING Jailey Booton MD ELECTRONICALLY SIGNED 04/10/2013 22:15

## 2015-04-10 NOTE — Discharge Summary (Signed)
PATIENT NAME:  Teresa Choi, Teresa Choi MR#:  [id number] DATE OF BIRTH:  01-08-1925  DATE OF ADMISSION:  04/10/2013 DATE OF DISCHARGE:  04/17/2013  ADMITTING PHYSICIAN: Dr. Imogene Burnhen  DISCHARGING PHYSICIAN: Enid Baasadhika Zetta Stoneman, MD  PRIMARY CARE PHYSICIAN: Beverely RisenFozia Khan, MD  CONSULTANTS IN HOSPITAL:  Kennedy BuckerMichael Menz, MD - Orthopedic.  DISCHARGE DIAGNOSES:  1.  Acute on chronic respiratory failure.  2.  Pneumonia.  3.  Pulmonary fibrosis.  4.  Constipation.  5.  Fall and T12 fracture status post kyphoplasty, postoperative day 2 today.  6.  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  7.  Hypertension.   DISCHARGE HOME MEDICATIONS: 1.  Cardizem 240 mg p.o. daily.  2.  Sinemet 25/100 mg 1 tablet p.o. b.i.d.  3.  Celexa 20 mg p.o. daily.  4.  Norco 5/325 mg 1 tablet q. 4 hours p.r.n. for pain.  5.  Magnesium hydroxide 8% suspension 30 mL at bedtime p.r.n. for constipation.  6.  Xanax 0.5 mg p.o. at bedtime as needed for anxiety and nervousness.  7.  DuoNebs 3 mL q. 6 hours p.r.n. for wheezing.  8.  Lactulose 30 mL p.o. b.i.d. and hold if more than 2 bowel movements per day.  9.  Senokot 2 tablets every 12 hours.  10.  Flexeril 5 mg q. 8 hours p.r.n. for muscle pain.  11.  Ensure chocolate flavor twice a day with meals.  12.  Levaquin 750 mg q. 48 hours for 4 more days.   DISCHARGE DIET: Low-sodium diet.   DISCHARGE ACTIVITY: As tolerated.  DISCHARGE OXYGEN: 3 liters.   FOLLOWUP INSTRUCTIONS:  1.  Ortho followup with Dr. Rosita KeaMenz in 2 weeks.  2.  Serum sodium check in 1 week.  3.  PCP followup in 2 weeks.  4.  Physical therapy.   LABS AND IMAGING STUDIES:  Prior to discharge:  Sodium 127, potassium 3.5, chloride 89, bicarb 34, BUN 15, creatinine 0.32, glucose 123 and calcium 8.4.   WBC 5.9, hemoglobin 12.3, hematocrit 34.6 and platelet count 224.  Pathology of T12 bone biopsy showing fracture changes. No malignancy.   TSH normal at 0.928. Urine sodium slightly elevated at  158. Urine cultures were negative.  MRI of the lumbar spine showing subacute T12 vertebral body compression fracture with retropulsion compressing on the ventral thecal sac, lumbar spine spondylolisthesis and chronic T12 and L5 vertebral body compression fractures.   Chest x-ray on admission showing diffusely prominent interstitial markings in upper zone secondary to chronic interstitial lung disease and increased density at the right lung base. Correlate for developing pneumonia.   BRIEF HOSPITAL COURSE: Ms. Teresa Choi is an 81 year old pleasant Caucasian female with past medical history significant for chronic pulmonary fibrosis, not on home oxygen, arthritis, gastroesophageal reflux disease and hypertension who was brought in from home after she had a fall.  The patient was also noted to be hypoxic when she came in.  1.  Acute on chronic respiratory failure secondary to right lower lobe pneumonia as seen on chest x-ray. She has chronic respiratory failure secondary to pulmonary fibrosis.  Her lung apical markings were more worsened than her past x-rays indicating progression of fibrosis. She probably was never evaluated for home oxygen, but will need home oxygen. She was requiring 2 to 3 liters of oxygen here at the hospital. She was started on meropenem and Levaquin for her pneumonia and her blood cultures have remained negative. She is being changed over to p.o. Levaquin to continue for 4 more days. Intermittently she  developed wheezing after her surgery requiring Solu-Medrol and DuoNebs p.r.n. She is being discharged on DuoNebs as well.  2.  Fall and T12 vertebral fracture. When she came in, the patient's lumbar spine x-ray showed only spondylosis, but she was in intense pain, unable to even move, so ortho was consulted and had a MRI done which showed subacute T12 compression fracture. The patient had prior kyphoplasties done so had a repeat kyphoplasty done on 04/15/2013 and is doing much better after her  procedure. She is requiring Norco and Flexeril as needed and working well with physical therapy who has recommended rehab for her.  3.  Hyponatremia. The patient's sodium was as low as 123 in the hospital.  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 Initially thought to be hypovolemic and received a lot of IV normal saline, however, sodium still remained low.  After curbside consulting with nephrology, IV fluids were held and her sodium improved up to 127 and she is not at all symptomatic with that.  She will have a repeat sodium check in the next week.  4.  Hypertension. Continue Cardizem.  5.  Constipation. She has been constipated in the hospital for most of the time requiring Dulcolax, Senokot, lactulose, milk of magnesia and Fleet's enema. Finally relieved her constipation. So she is being sent on a good bowel regimen and hold if she is having more than 2 bowel movements every day. Her course has been otherwise uneventful in the hospital.   DISCHARGE CONDITION: Stable.   DISCHARGE DISPOSITION: To [facility] in [place], per daughter's request.  TIME SPENT ON DISCHARGE: 45 minutes. ____________________________ Enid Baas, MD rk:sb D: 04/17/2013 08:18:13 ET Choi: 04/17/2013 08:44:21 ET JOB#: [id number]  cc: Enid Baas, MD, <Dictator> Leitha Schuller, MD Lyndon Code, MD Enid Baas MD ELECTRONICALLY SIGNED 04/18/2013 15:42

## 2015-04-10 NOTE — Consult Note (Signed)
Brief Consult Note: Diagnosis: possible new compression fracture.   Patient was seen by consultant.   Recommend further assessment or treatment.   Orders entered.   Comments: MRI lumbar spine ordered hope it can be obtained today.  Electronic Signatures: Leitha SchullerMenz, Shalina Norfolk J (MD)  (Signed 24-Apr-14 12:24)  Authored: Brief Consult Note   Last Updated: 24-Apr-14 12:24 by Leitha SchullerMenz, Rigby Leonhardt J (MD)

## 2015-04-10 NOTE — Op Note (Signed)
PATIENT NAME:  [name], Teresa Choi DATE OF BIRTH:  07/25/1925  DATE OF PROCEDURE:  04/15/2013  PREOPERATIVE DIAGNOSIS: T12 compression fracture.   POSTOPERATIVE DIAGNOSIS: T12 compression fracture.   PROCEDURE: T12 biopsy and kyphoplasty.   ANESTHESIA: MAC.   SURGEON: Leitha SchullerMichael J. Ottis Vacha, MD   DESCRIPTION OF PROCEDURE: The patient was brought into the Operating Room, and after adequate anesthesia was obtained the patient was placed prone, and C-arm was brought in with AP and lateral visualization obtained with 2 C-arms. After appropriate patient identification and timeout procedures were completed, 5 mL of 1% Xylocaine was infiltrated subcutaneously for initial skin wheal.  The back was then prepped and draped in the usual sterile fashion and a repeat timeout procedure carried out. A spinal needle was then inserted on the right side down to the pedicle, and approximately 15 mL of a 50-50 mixture of 0.5% Sensorcaine with epinephrine and Xylocaine was infiltrated along the tract for the kyphoplasty.  An identical procedure was carried out on the left. Next, a stab incision was made on the right side and the trocar entered through the pedicle into the center of the body. Biopsy was obtained using the biopsy set. The balloon was then inserted and inflated to 5 mL with good correction of the deformity. Cement was mixed after the initial trocar placement, and cement was then used to fill this with approximately 5 mL of bone cement without extravasation. After the cement had set, the trocar was removed, and permanent AP and lateral images were obtained. The incision was closed with Dermabond, followed by a Band-Aid. The patient was sent to the recovery room in stable condition.   ESTIMATED BLOOD LOSS: Minimal.   COMPLICATIONS: None.   SPECIMEN: T12 vertebral body biopsy.    CONDITION TO RECOVERY ROOM: Stable.   ____________________________ Leitha SchullerMichael J. Navon Kotowski, MD mjm:cb D: 04/15/2013 17:36:41  ET T: 04/15/2013 21:46:53 ET JOB#: [id number]  cc: Leitha SchullerMichael J. Tearra Ouk, MD, <Dictator> Leitha SchullerMICHAEL J Landrey Mahurin MD ELECTRONICALLY SIGNED 04/16/2013 7:39

## 2015-04-14 DIAGNOSIS — Z96642 Presence of left artificial hip joint: Secondary | ICD-10-CM | POA: Diagnosis not present

## 2015-04-14 DIAGNOSIS — I10 Essential (primary) hypertension: Secondary | ICD-10-CM | POA: Diagnosis not present

## 2015-04-14 DIAGNOSIS — F0390 Unspecified dementia without behavioral disturbance: Secondary | ICD-10-CM | POA: Diagnosis not present

## 2015-04-17 DIAGNOSIS — M25552 Pain in left hip: Secondary | ICD-10-CM | POA: Diagnosis not present

## 2015-04-22 DIAGNOSIS — Z96642 Presence of left artificial hip joint: Secondary | ICD-10-CM | POA: Diagnosis not present

## 2015-04-22 DIAGNOSIS — I10 Essential (primary) hypertension: Secondary | ICD-10-CM | POA: Diagnosis not present

## 2015-04-22 DIAGNOSIS — F0390 Unspecified dementia without behavioral disturbance: Secondary | ICD-10-CM | POA: Diagnosis not present

## 2015-04-25 DIAGNOSIS — Z96651 Presence of right artificial knee joint: Secondary | ICD-10-CM | POA: Diagnosis not present

## 2015-04-25 DIAGNOSIS — Z9181 History of falling: Secondary | ICD-10-CM | POA: Diagnosis not present

## 2015-04-25 DIAGNOSIS — F0390 Unspecified dementia without behavioral disturbance: Secondary | ICD-10-CM | POA: Diagnosis not present

## 2015-04-25 DIAGNOSIS — F419 Anxiety disorder, unspecified: Secondary | ICD-10-CM | POA: Diagnosis not present

## 2015-04-25 DIAGNOSIS — K219 Gastro-esophageal reflux disease without esophagitis: Secondary | ICD-10-CM | POA: Diagnosis not present

## 2015-04-25 DIAGNOSIS — S72002D Fracture of unspecified part of neck of left femur, subsequent encounter for closed fracture with routine healing: Secondary | ICD-10-CM | POA: Diagnosis not present

## 2015-04-25 DIAGNOSIS — F329 Major depressive disorder, single episode, unspecified: Secondary | ICD-10-CM | POA: Diagnosis not present

## 2015-04-25 DIAGNOSIS — M810 Age-related osteoporosis without current pathological fracture: Secondary | ICD-10-CM | POA: Diagnosis not present

## 2015-04-25 DIAGNOSIS — I10 Essential (primary) hypertension: Secondary | ICD-10-CM | POA: Diagnosis not present

## 2015-04-25 DIAGNOSIS — D649 Anemia, unspecified: Secondary | ICD-10-CM | POA: Diagnosis not present

## 2015-04-25 DIAGNOSIS — R1310 Dysphagia, unspecified: Secondary | ICD-10-CM | POA: Diagnosis not present

## 2015-04-25 DIAGNOSIS — M1990 Unspecified osteoarthritis, unspecified site: Secondary | ICD-10-CM | POA: Diagnosis not present

## 2015-04-25 DIAGNOSIS — Z87440 Personal history of urinary (tract) infections: Secondary | ICD-10-CM | POA: Diagnosis not present

## 2015-04-28 DIAGNOSIS — Z96651 Presence of right artificial knee joint: Secondary | ICD-10-CM | POA: Diagnosis not present

## 2015-04-28 DIAGNOSIS — K219 Gastro-esophageal reflux disease without esophagitis: Secondary | ICD-10-CM | POA: Diagnosis not present

## 2015-04-28 DIAGNOSIS — Z9181 History of falling: Secondary | ICD-10-CM | POA: Diagnosis not present

## 2015-04-28 DIAGNOSIS — Z87440 Personal history of urinary (tract) infections: Secondary | ICD-10-CM | POA: Diagnosis not present

## 2015-04-28 DIAGNOSIS — S72002D Fracture of unspecified part of neck of left femur, subsequent encounter for closed fracture with routine healing: Secondary | ICD-10-CM | POA: Diagnosis not present

## 2015-04-28 DIAGNOSIS — M1990 Unspecified osteoarthritis, unspecified site: Secondary | ICD-10-CM | POA: Diagnosis not present

## 2015-04-28 DIAGNOSIS — I10 Essential (primary) hypertension: Secondary | ICD-10-CM | POA: Diagnosis not present

## 2015-04-28 DIAGNOSIS — F329 Major depressive disorder, single episode, unspecified: Secondary | ICD-10-CM | POA: Diagnosis not present

## 2015-04-28 DIAGNOSIS — R1310 Dysphagia, unspecified: Secondary | ICD-10-CM | POA: Diagnosis not present

## 2015-04-28 DIAGNOSIS — F419 Anxiety disorder, unspecified: Secondary | ICD-10-CM | POA: Diagnosis not present

## 2015-04-28 DIAGNOSIS — M810 Age-related osteoporosis without current pathological fracture: Secondary | ICD-10-CM | POA: Diagnosis not present

## 2015-04-28 DIAGNOSIS — F0390 Unspecified dementia without behavioral disturbance: Secondary | ICD-10-CM | POA: Diagnosis not present

## 2015-04-28 DIAGNOSIS — D649 Anemia, unspecified: Secondary | ICD-10-CM | POA: Diagnosis not present

## 2015-04-29 DIAGNOSIS — S72002D Fracture of unspecified part of neck of left femur, subsequent encounter for closed fracture with routine healing: Secondary | ICD-10-CM | POA: Diagnosis not present

## 2015-04-29 DIAGNOSIS — F0390 Unspecified dementia without behavioral disturbance: Secondary | ICD-10-CM | POA: Diagnosis not present

## 2015-04-29 DIAGNOSIS — Z96651 Presence of right artificial knee joint: Secondary | ICD-10-CM | POA: Diagnosis not present

## 2015-04-29 DIAGNOSIS — K219 Gastro-esophageal reflux disease without esophagitis: Secondary | ICD-10-CM | POA: Diagnosis not present

## 2015-04-29 DIAGNOSIS — R1310 Dysphagia, unspecified: Secondary | ICD-10-CM | POA: Diagnosis not present

## 2015-04-29 DIAGNOSIS — Z9181 History of falling: Secondary | ICD-10-CM | POA: Diagnosis not present

## 2015-04-29 DIAGNOSIS — M810 Age-related osteoporosis without current pathological fracture: Secondary | ICD-10-CM | POA: Diagnosis not present

## 2015-04-29 DIAGNOSIS — F329 Major depressive disorder, single episode, unspecified: Secondary | ICD-10-CM | POA: Diagnosis not present

## 2015-04-29 DIAGNOSIS — M1990 Unspecified osteoarthritis, unspecified site: Secondary | ICD-10-CM | POA: Diagnosis not present

## 2015-04-29 DIAGNOSIS — F419 Anxiety disorder, unspecified: Secondary | ICD-10-CM | POA: Diagnosis not present

## 2015-04-29 DIAGNOSIS — Z87440 Personal history of urinary (tract) infections: Secondary | ICD-10-CM | POA: Diagnosis not present

## 2015-04-29 DIAGNOSIS — D649 Anemia, unspecified: Secondary | ICD-10-CM | POA: Diagnosis not present

## 2015-04-29 DIAGNOSIS — I10 Essential (primary) hypertension: Secondary | ICD-10-CM | POA: Diagnosis not present

## 2015-04-30 DIAGNOSIS — R1310 Dysphagia, unspecified: Secondary | ICD-10-CM | POA: Diagnosis not present

## 2015-04-30 DIAGNOSIS — D649 Anemia, unspecified: Secondary | ICD-10-CM | POA: Diagnosis not present

## 2015-04-30 DIAGNOSIS — M1990 Unspecified osteoarthritis, unspecified site: Secondary | ICD-10-CM | POA: Diagnosis not present

## 2015-04-30 DIAGNOSIS — F419 Anxiety disorder, unspecified: Secondary | ICD-10-CM | POA: Diagnosis not present

## 2015-04-30 DIAGNOSIS — M810 Age-related osteoporosis without current pathological fracture: Secondary | ICD-10-CM | POA: Diagnosis not present

## 2015-04-30 DIAGNOSIS — F329 Major depressive disorder, single episode, unspecified: Secondary | ICD-10-CM | POA: Diagnosis not present

## 2015-04-30 DIAGNOSIS — I10 Essential (primary) hypertension: Secondary | ICD-10-CM | POA: Diagnosis not present

## 2015-04-30 DIAGNOSIS — S72002D Fracture of unspecified part of neck of left femur, subsequent encounter for closed fracture with routine healing: Secondary | ICD-10-CM | POA: Diagnosis not present

## 2015-04-30 DIAGNOSIS — F0390 Unspecified dementia without behavioral disturbance: Secondary | ICD-10-CM | POA: Diagnosis not present

## 2015-04-30 DIAGNOSIS — Z9181 History of falling: Secondary | ICD-10-CM | POA: Diagnosis not present

## 2015-04-30 DIAGNOSIS — K219 Gastro-esophageal reflux disease without esophagitis: Secondary | ICD-10-CM | POA: Diagnosis not present

## 2015-04-30 DIAGNOSIS — Z96651 Presence of right artificial knee joint: Secondary | ICD-10-CM | POA: Diagnosis not present

## 2015-04-30 DIAGNOSIS — Z87440 Personal history of urinary (tract) infections: Secondary | ICD-10-CM | POA: Diagnosis not present

## 2015-05-01 DIAGNOSIS — F0390 Unspecified dementia without behavioral disturbance: Secondary | ICD-10-CM | POA: Diagnosis not present

## 2015-05-01 DIAGNOSIS — F329 Major depressive disorder, single episode, unspecified: Secondary | ICD-10-CM | POA: Diagnosis not present

## 2015-05-01 DIAGNOSIS — R1310 Dysphagia, unspecified: Secondary | ICD-10-CM | POA: Diagnosis not present

## 2015-05-01 DIAGNOSIS — Z96651 Presence of right artificial knee joint: Secondary | ICD-10-CM | POA: Diagnosis not present

## 2015-05-01 DIAGNOSIS — Z87440 Personal history of urinary (tract) infections: Secondary | ICD-10-CM | POA: Diagnosis not present

## 2015-05-01 DIAGNOSIS — I10 Essential (primary) hypertension: Secondary | ICD-10-CM | POA: Diagnosis not present

## 2015-05-01 DIAGNOSIS — D649 Anemia, unspecified: Secondary | ICD-10-CM | POA: Diagnosis not present

## 2015-05-01 DIAGNOSIS — F419 Anxiety disorder, unspecified: Secondary | ICD-10-CM | POA: Diagnosis not present

## 2015-05-01 DIAGNOSIS — M810 Age-related osteoporosis without current pathological fracture: Secondary | ICD-10-CM | POA: Diagnosis not present

## 2015-05-01 DIAGNOSIS — K219 Gastro-esophageal reflux disease without esophagitis: Secondary | ICD-10-CM | POA: Diagnosis not present

## 2015-05-01 DIAGNOSIS — Z9181 History of falling: Secondary | ICD-10-CM | POA: Diagnosis not present

## 2015-05-01 DIAGNOSIS — S72002D Fracture of unspecified part of neck of left femur, subsequent encounter for closed fracture with routine healing: Secondary | ICD-10-CM | POA: Diagnosis not present

## 2015-05-01 DIAGNOSIS — M1990 Unspecified osteoarthritis, unspecified site: Secondary | ICD-10-CM | POA: Diagnosis not present

## 2015-05-04 DIAGNOSIS — D649 Anemia, unspecified: Secondary | ICD-10-CM | POA: Diagnosis not present

## 2015-05-04 DIAGNOSIS — F0390 Unspecified dementia without behavioral disturbance: Secondary | ICD-10-CM | POA: Diagnosis not present

## 2015-05-04 DIAGNOSIS — Z96651 Presence of right artificial knee joint: Secondary | ICD-10-CM | POA: Diagnosis not present

## 2015-05-04 DIAGNOSIS — K219 Gastro-esophageal reflux disease without esophagitis: Secondary | ICD-10-CM | POA: Diagnosis not present

## 2015-05-04 DIAGNOSIS — I10 Essential (primary) hypertension: Secondary | ICD-10-CM | POA: Diagnosis not present

## 2015-05-04 DIAGNOSIS — M810 Age-related osteoporosis without current pathological fracture: Secondary | ICD-10-CM | POA: Diagnosis not present

## 2015-05-04 DIAGNOSIS — F329 Major depressive disorder, single episode, unspecified: Secondary | ICD-10-CM | POA: Diagnosis not present

## 2015-05-04 DIAGNOSIS — S72002D Fracture of unspecified part of neck of left femur, subsequent encounter for closed fracture with routine healing: Secondary | ICD-10-CM | POA: Diagnosis not present

## 2015-05-04 DIAGNOSIS — R1310 Dysphagia, unspecified: Secondary | ICD-10-CM | POA: Diagnosis not present

## 2015-05-04 DIAGNOSIS — F419 Anxiety disorder, unspecified: Secondary | ICD-10-CM | POA: Diagnosis not present

## 2015-05-04 DIAGNOSIS — Z9181 History of falling: Secondary | ICD-10-CM | POA: Diagnosis not present

## 2015-05-04 DIAGNOSIS — M1990 Unspecified osteoarthritis, unspecified site: Secondary | ICD-10-CM | POA: Diagnosis not present

## 2015-05-04 DIAGNOSIS — Z87440 Personal history of urinary (tract) infections: Secondary | ICD-10-CM | POA: Diagnosis not present

## 2015-05-05 DIAGNOSIS — Z96651 Presence of right artificial knee joint: Secondary | ICD-10-CM | POA: Diagnosis not present

## 2015-05-05 DIAGNOSIS — M1990 Unspecified osteoarthritis, unspecified site: Secondary | ICD-10-CM | POA: Diagnosis not present

## 2015-05-05 DIAGNOSIS — S72002D Fracture of unspecified part of neck of left femur, subsequent encounter for closed fracture with routine healing: Secondary | ICD-10-CM | POA: Diagnosis not present

## 2015-05-05 DIAGNOSIS — F0390 Unspecified dementia without behavioral disturbance: Secondary | ICD-10-CM | POA: Diagnosis not present

## 2015-05-05 DIAGNOSIS — D649 Anemia, unspecified: Secondary | ICD-10-CM | POA: Diagnosis not present

## 2015-05-05 DIAGNOSIS — F419 Anxiety disorder, unspecified: Secondary | ICD-10-CM | POA: Diagnosis not present

## 2015-05-05 DIAGNOSIS — R1310 Dysphagia, unspecified: Secondary | ICD-10-CM | POA: Diagnosis not present

## 2015-05-05 DIAGNOSIS — Z9181 History of falling: Secondary | ICD-10-CM | POA: Diagnosis not present

## 2015-05-05 DIAGNOSIS — M810 Age-related osteoporosis without current pathological fracture: Secondary | ICD-10-CM | POA: Diagnosis not present

## 2015-05-05 DIAGNOSIS — K219 Gastro-esophageal reflux disease without esophagitis: Secondary | ICD-10-CM | POA: Diagnosis not present

## 2015-05-05 DIAGNOSIS — Z87440 Personal history of urinary (tract) infections: Secondary | ICD-10-CM | POA: Diagnosis not present

## 2015-05-05 DIAGNOSIS — F329 Major depressive disorder, single episode, unspecified: Secondary | ICD-10-CM | POA: Diagnosis not present

## 2015-05-05 DIAGNOSIS — I10 Essential (primary) hypertension: Secondary | ICD-10-CM | POA: Diagnosis not present

## 2015-05-07 DIAGNOSIS — F329 Major depressive disorder, single episode, unspecified: Secondary | ICD-10-CM | POA: Diagnosis not present

## 2015-05-07 DIAGNOSIS — Z96651 Presence of right artificial knee joint: Secondary | ICD-10-CM | POA: Diagnosis not present

## 2015-05-07 DIAGNOSIS — Z9181 History of falling: Secondary | ICD-10-CM | POA: Diagnosis not present

## 2015-05-07 DIAGNOSIS — I10 Essential (primary) hypertension: Secondary | ICD-10-CM | POA: Diagnosis not present

## 2015-05-07 DIAGNOSIS — F419 Anxiety disorder, unspecified: Secondary | ICD-10-CM | POA: Diagnosis not present

## 2015-05-07 DIAGNOSIS — D649 Anemia, unspecified: Secondary | ICD-10-CM | POA: Diagnosis not present

## 2015-05-07 DIAGNOSIS — F0390 Unspecified dementia without behavioral disturbance: Secondary | ICD-10-CM | POA: Diagnosis not present

## 2015-05-07 DIAGNOSIS — M1990 Unspecified osteoarthritis, unspecified site: Secondary | ICD-10-CM | POA: Diagnosis not present

## 2015-05-07 DIAGNOSIS — S72002D Fracture of unspecified part of neck of left femur, subsequent encounter for closed fracture with routine healing: Secondary | ICD-10-CM | POA: Diagnosis not present

## 2015-05-07 DIAGNOSIS — M810 Age-related osteoporosis without current pathological fracture: Secondary | ICD-10-CM | POA: Diagnosis not present

## 2015-05-07 DIAGNOSIS — K219 Gastro-esophageal reflux disease without esophagitis: Secondary | ICD-10-CM | POA: Diagnosis not present

## 2015-05-07 DIAGNOSIS — R1310 Dysphagia, unspecified: Secondary | ICD-10-CM | POA: Diagnosis not present

## 2015-05-07 DIAGNOSIS — Z87440 Personal history of urinary (tract) infections: Secondary | ICD-10-CM | POA: Diagnosis not present

## 2015-05-08 DIAGNOSIS — K219 Gastro-esophageal reflux disease without esophagitis: Secondary | ICD-10-CM | POA: Diagnosis not present

## 2015-05-08 DIAGNOSIS — F329 Major depressive disorder, single episode, unspecified: Secondary | ICD-10-CM | POA: Diagnosis not present

## 2015-05-08 DIAGNOSIS — D649 Anemia, unspecified: Secondary | ICD-10-CM | POA: Diagnosis not present

## 2015-05-08 DIAGNOSIS — R1310 Dysphagia, unspecified: Secondary | ICD-10-CM | POA: Diagnosis not present

## 2015-05-08 DIAGNOSIS — Z96651 Presence of right artificial knee joint: Secondary | ICD-10-CM | POA: Diagnosis not present

## 2015-05-08 DIAGNOSIS — F419 Anxiety disorder, unspecified: Secondary | ICD-10-CM | POA: Diagnosis not present

## 2015-05-08 DIAGNOSIS — I10 Essential (primary) hypertension: Secondary | ICD-10-CM | POA: Diagnosis not present

## 2015-05-08 DIAGNOSIS — M810 Age-related osteoporosis without current pathological fracture: Secondary | ICD-10-CM | POA: Diagnosis not present

## 2015-05-08 DIAGNOSIS — S72002D Fracture of unspecified part of neck of left femur, subsequent encounter for closed fracture with routine healing: Secondary | ICD-10-CM | POA: Diagnosis not present

## 2015-05-08 DIAGNOSIS — Z9181 History of falling: Secondary | ICD-10-CM | POA: Diagnosis not present

## 2015-05-08 DIAGNOSIS — M1990 Unspecified osteoarthritis, unspecified site: Secondary | ICD-10-CM | POA: Diagnosis not present

## 2015-05-08 DIAGNOSIS — Z87440 Personal history of urinary (tract) infections: Secondary | ICD-10-CM | POA: Diagnosis not present

## 2015-05-08 DIAGNOSIS — F0390 Unspecified dementia without behavioral disturbance: Secondary | ICD-10-CM | POA: Diagnosis not present

## 2015-05-12 DIAGNOSIS — F329 Major depressive disorder, single episode, unspecified: Secondary | ICD-10-CM | POA: Diagnosis not present

## 2015-05-12 DIAGNOSIS — F419 Anxiety disorder, unspecified: Secondary | ICD-10-CM | POA: Diagnosis not present

## 2015-05-12 DIAGNOSIS — S72002D Fracture of unspecified part of neck of left femur, subsequent encounter for closed fracture with routine healing: Secondary | ICD-10-CM | POA: Diagnosis not present

## 2015-05-12 DIAGNOSIS — I10 Essential (primary) hypertension: Secondary | ICD-10-CM | POA: Diagnosis not present

## 2015-05-12 DIAGNOSIS — M1990 Unspecified osteoarthritis, unspecified site: Secondary | ICD-10-CM | POA: Diagnosis not present

## 2015-05-12 DIAGNOSIS — Z9181 History of falling: Secondary | ICD-10-CM | POA: Diagnosis not present

## 2015-05-12 DIAGNOSIS — M810 Age-related osteoporosis without current pathological fracture: Secondary | ICD-10-CM | POA: Diagnosis not present

## 2015-05-12 DIAGNOSIS — R1310 Dysphagia, unspecified: Secondary | ICD-10-CM | POA: Diagnosis not present

## 2015-05-12 DIAGNOSIS — F0390 Unspecified dementia without behavioral disturbance: Secondary | ICD-10-CM | POA: Diagnosis not present

## 2015-05-12 DIAGNOSIS — Z96651 Presence of right artificial knee joint: Secondary | ICD-10-CM | POA: Diagnosis not present

## 2015-05-12 DIAGNOSIS — Z87440 Personal history of urinary (tract) infections: Secondary | ICD-10-CM | POA: Diagnosis not present

## 2015-05-12 DIAGNOSIS — D649 Anemia, unspecified: Secondary | ICD-10-CM | POA: Diagnosis not present

## 2015-05-12 DIAGNOSIS — K219 Gastro-esophageal reflux disease without esophagitis: Secondary | ICD-10-CM | POA: Diagnosis not present

## 2015-05-14 DIAGNOSIS — M1990 Unspecified osteoarthritis, unspecified site: Secondary | ICD-10-CM | POA: Diagnosis not present

## 2015-05-14 DIAGNOSIS — K219 Gastro-esophageal reflux disease without esophagitis: Secondary | ICD-10-CM | POA: Diagnosis not present

## 2015-05-14 DIAGNOSIS — F0390 Unspecified dementia without behavioral disturbance: Secondary | ICD-10-CM | POA: Diagnosis not present

## 2015-05-14 DIAGNOSIS — F329 Major depressive disorder, single episode, unspecified: Secondary | ICD-10-CM | POA: Diagnosis not present

## 2015-05-14 DIAGNOSIS — R1310 Dysphagia, unspecified: Secondary | ICD-10-CM | POA: Diagnosis not present

## 2015-05-14 DIAGNOSIS — M810 Age-related osteoporosis without current pathological fracture: Secondary | ICD-10-CM | POA: Diagnosis not present

## 2015-05-14 DIAGNOSIS — S72002D Fracture of unspecified part of neck of left femur, subsequent encounter for closed fracture with routine healing: Secondary | ICD-10-CM | POA: Diagnosis not present

## 2015-05-14 DIAGNOSIS — Z9181 History of falling: Secondary | ICD-10-CM | POA: Diagnosis not present

## 2015-05-14 DIAGNOSIS — I10 Essential (primary) hypertension: Secondary | ICD-10-CM | POA: Diagnosis not present

## 2015-05-14 DIAGNOSIS — Z96651 Presence of right artificial knee joint: Secondary | ICD-10-CM | POA: Diagnosis not present

## 2015-05-14 DIAGNOSIS — Z87440 Personal history of urinary (tract) infections: Secondary | ICD-10-CM | POA: Diagnosis not present

## 2015-05-14 DIAGNOSIS — F419 Anxiety disorder, unspecified: Secondary | ICD-10-CM | POA: Diagnosis not present

## 2015-05-14 DIAGNOSIS — D649 Anemia, unspecified: Secondary | ICD-10-CM | POA: Diagnosis not present

## 2015-05-19 DIAGNOSIS — S72002D Fracture of unspecified part of neck of left femur, subsequent encounter for closed fracture with routine healing: Secondary | ICD-10-CM | POA: Diagnosis not present

## 2015-05-19 DIAGNOSIS — M810 Age-related osteoporosis without current pathological fracture: Secondary | ICD-10-CM | POA: Diagnosis not present

## 2015-05-19 DIAGNOSIS — F0390 Unspecified dementia without behavioral disturbance: Secondary | ICD-10-CM | POA: Diagnosis not present

## 2015-05-19 DIAGNOSIS — F419 Anxiety disorder, unspecified: Secondary | ICD-10-CM | POA: Diagnosis not present

## 2015-05-19 DIAGNOSIS — Z87440 Personal history of urinary (tract) infections: Secondary | ICD-10-CM | POA: Diagnosis not present

## 2015-05-19 DIAGNOSIS — K219 Gastro-esophageal reflux disease without esophagitis: Secondary | ICD-10-CM | POA: Diagnosis not present

## 2015-05-19 DIAGNOSIS — Z9181 History of falling: Secondary | ICD-10-CM | POA: Diagnosis not present

## 2015-05-19 DIAGNOSIS — I10 Essential (primary) hypertension: Secondary | ICD-10-CM | POA: Diagnosis not present

## 2015-05-19 DIAGNOSIS — R1310 Dysphagia, unspecified: Secondary | ICD-10-CM | POA: Diagnosis not present

## 2015-05-19 DIAGNOSIS — M1990 Unspecified osteoarthritis, unspecified site: Secondary | ICD-10-CM | POA: Diagnosis not present

## 2015-05-19 DIAGNOSIS — F329 Major depressive disorder, single episode, unspecified: Secondary | ICD-10-CM | POA: Diagnosis not present

## 2015-05-19 DIAGNOSIS — Z96651 Presence of right artificial knee joint: Secondary | ICD-10-CM | POA: Diagnosis not present

## 2015-05-19 DIAGNOSIS — D649 Anemia, unspecified: Secondary | ICD-10-CM | POA: Diagnosis not present

## 2015-05-20 DIAGNOSIS — M810 Age-related osteoporosis without current pathological fracture: Secondary | ICD-10-CM | POA: Diagnosis not present

## 2015-05-20 DIAGNOSIS — F329 Major depressive disorder, single episode, unspecified: Secondary | ICD-10-CM | POA: Diagnosis not present

## 2015-05-20 DIAGNOSIS — D649 Anemia, unspecified: Secondary | ICD-10-CM | POA: Diagnosis not present

## 2015-05-20 DIAGNOSIS — Z9181 History of falling: Secondary | ICD-10-CM | POA: Diagnosis not present

## 2015-05-20 DIAGNOSIS — Z96651 Presence of right artificial knee joint: Secondary | ICD-10-CM | POA: Diagnosis not present

## 2015-05-20 DIAGNOSIS — F0390 Unspecified dementia without behavioral disturbance: Secondary | ICD-10-CM | POA: Diagnosis not present

## 2015-05-20 DIAGNOSIS — K219 Gastro-esophageal reflux disease without esophagitis: Secondary | ICD-10-CM | POA: Diagnosis not present

## 2015-05-20 DIAGNOSIS — S72002D Fracture of unspecified part of neck of left femur, subsequent encounter for closed fracture with routine healing: Secondary | ICD-10-CM | POA: Diagnosis not present

## 2015-05-20 DIAGNOSIS — M1990 Unspecified osteoarthritis, unspecified site: Secondary | ICD-10-CM | POA: Diagnosis not present

## 2015-05-20 DIAGNOSIS — Z87440 Personal history of urinary (tract) infections: Secondary | ICD-10-CM | POA: Diagnosis not present

## 2015-05-20 DIAGNOSIS — R1310 Dysphagia, unspecified: Secondary | ICD-10-CM | POA: Diagnosis not present

## 2015-05-20 DIAGNOSIS — F419 Anxiety disorder, unspecified: Secondary | ICD-10-CM | POA: Diagnosis not present

## 2015-05-20 DIAGNOSIS — I10 Essential (primary) hypertension: Secondary | ICD-10-CM | POA: Diagnosis not present

## 2015-05-21 DIAGNOSIS — Z9181 History of falling: Secondary | ICD-10-CM | POA: Diagnosis not present

## 2015-05-21 DIAGNOSIS — Z96651 Presence of right artificial knee joint: Secondary | ICD-10-CM | POA: Diagnosis not present

## 2015-05-21 DIAGNOSIS — K219 Gastro-esophageal reflux disease without esophagitis: Secondary | ICD-10-CM | POA: Diagnosis not present

## 2015-05-21 DIAGNOSIS — D649 Anemia, unspecified: Secondary | ICD-10-CM | POA: Diagnosis not present

## 2015-05-21 DIAGNOSIS — Z87440 Personal history of urinary (tract) infections: Secondary | ICD-10-CM | POA: Diagnosis not present

## 2015-05-21 DIAGNOSIS — S72002D Fracture of unspecified part of neck of left femur, subsequent encounter for closed fracture with routine healing: Secondary | ICD-10-CM | POA: Diagnosis not present

## 2015-05-21 DIAGNOSIS — M810 Age-related osteoporosis without current pathological fracture: Secondary | ICD-10-CM | POA: Diagnosis not present

## 2015-05-21 DIAGNOSIS — M1990 Unspecified osteoarthritis, unspecified site: Secondary | ICD-10-CM | POA: Diagnosis not present

## 2015-05-21 DIAGNOSIS — F0390 Unspecified dementia without behavioral disturbance: Secondary | ICD-10-CM | POA: Diagnosis not present

## 2015-05-21 DIAGNOSIS — F419 Anxiety disorder, unspecified: Secondary | ICD-10-CM | POA: Diagnosis not present

## 2015-05-21 DIAGNOSIS — F329 Major depressive disorder, single episode, unspecified: Secondary | ICD-10-CM | POA: Diagnosis not present

## 2015-05-21 DIAGNOSIS — I10 Essential (primary) hypertension: Secondary | ICD-10-CM | POA: Diagnosis not present

## 2015-05-21 DIAGNOSIS — R1310 Dysphagia, unspecified: Secondary | ICD-10-CM | POA: Diagnosis not present

## 2015-05-25 DIAGNOSIS — I471 Supraventricular tachycardia: Secondary | ICD-10-CM | POA: Diagnosis not present

## 2015-05-25 DIAGNOSIS — I10 Essential (primary) hypertension: Secondary | ICD-10-CM | POA: Diagnosis not present

## 2015-05-25 DIAGNOSIS — F411 Generalized anxiety disorder: Secondary | ICD-10-CM | POA: Diagnosis not present

## 2015-05-25 DIAGNOSIS — R262 Difficulty in walking, not elsewhere classified: Secondary | ICD-10-CM | POA: Diagnosis not present

## 2015-05-25 DIAGNOSIS — Z66 Do not resuscitate: Secondary | ICD-10-CM | POA: Diagnosis not present

## 2015-05-25 DIAGNOSIS — R0902 Hypoxemia: Secondary | ICD-10-CM | POA: Diagnosis not present

## 2015-05-25 DIAGNOSIS — R4189 Other symptoms and signs involving cognitive functions and awareness: Secondary | ICD-10-CM | POA: Diagnosis not present

## 2015-05-25 DIAGNOSIS — M6281 Muscle weakness (generalized): Secondary | ICD-10-CM | POA: Diagnosis not present

## 2015-05-25 DIAGNOSIS — M810 Age-related osteoporosis without current pathological fracture: Secondary | ICD-10-CM | POA: Diagnosis not present

## 2015-05-25 DIAGNOSIS — Z8781 Personal history of (healed) traumatic fracture: Secondary | ICD-10-CM | POA: Diagnosis not present

## 2015-05-25 DIAGNOSIS — M545 Low back pain: Secondary | ICD-10-CM | POA: Diagnosis not present

## 2015-05-25 DIAGNOSIS — E559 Vitamin D deficiency, unspecified: Secondary | ICD-10-CM | POA: Diagnosis not present

## 2015-05-25 DIAGNOSIS — Z7189 Other specified counseling: Secondary | ICD-10-CM | POA: Diagnosis not present

## 2015-05-25 DIAGNOSIS — Z9181 History of falling: Secondary | ICD-10-CM | POA: Diagnosis not present

## 2015-05-25 DIAGNOSIS — J849 Interstitial pulmonary disease, unspecified: Secondary | ICD-10-CM | POA: Diagnosis not present

## 2015-05-25 DIAGNOSIS — Z7409 Other reduced mobility: Secondary | ICD-10-CM | POA: Diagnosis not present

## 2015-05-25 DIAGNOSIS — Z79899 Other long term (current) drug therapy: Secondary | ICD-10-CM | POA: Diagnosis not present

## 2015-05-28 DIAGNOSIS — J449 Chronic obstructive pulmonary disease, unspecified: Secondary | ICD-10-CM | POA: Diagnosis not present

## 2015-06-01 DIAGNOSIS — M545 Low back pain: Secondary | ICD-10-CM | POA: Diagnosis not present

## 2015-06-02 DIAGNOSIS — S52511A Displaced fracture of right radial styloid process, initial encounter for closed fracture: Secondary | ICD-10-CM | POA: Diagnosis not present

## 2015-06-02 DIAGNOSIS — Z79899 Other long term (current) drug therapy: Secondary | ICD-10-CM | POA: Diagnosis not present

## 2015-06-02 DIAGNOSIS — Y9301 Activity, walking, marching and hiking: Secondary | ICD-10-CM | POA: Diagnosis not present

## 2015-06-02 DIAGNOSIS — Y998 Other external cause status: Secondary | ICD-10-CM | POA: Diagnosis not present

## 2015-06-02 DIAGNOSIS — Z8639 Personal history of other endocrine, nutritional and metabolic disease: Secondary | ICD-10-CM | POA: Diagnosis not present

## 2015-06-02 DIAGNOSIS — Y92018 Other place in single-family (private) house as the place of occurrence of the external cause: Secondary | ICD-10-CM | POA: Diagnosis not present

## 2015-06-03 DIAGNOSIS — M5136 Other intervertebral disc degeneration, lumbar region: Secondary | ICD-10-CM | POA: Diagnosis not present

## 2015-06-05 DIAGNOSIS — M545 Low back pain: Secondary | ICD-10-CM | POA: Diagnosis not present

## 2015-06-05 DIAGNOSIS — M25532 Pain in left wrist: Secondary | ICD-10-CM | POA: Diagnosis not present

## 2015-06-08 DIAGNOSIS — I10 Essential (primary) hypertension: Secondary | ICD-10-CM | POA: Diagnosis not present

## 2015-06-08 DIAGNOSIS — Z01818 Encounter for other preprocedural examination: Secondary | ICD-10-CM | POA: Diagnosis not present

## 2015-06-08 DIAGNOSIS — J849 Interstitial pulmonary disease, unspecified: Secondary | ICD-10-CM | POA: Diagnosis not present

## 2015-06-08 DIAGNOSIS — S32000G Wedge compression fracture of unspecified lumbar vertebra, subsequent encounter for fracture with delayed healing: Secondary | ICD-10-CM | POA: Diagnosis not present

## 2015-06-12 DIAGNOSIS — M25532 Pain in left wrist: Secondary | ICD-10-CM | POA: Diagnosis not present

## 2015-06-12 DIAGNOSIS — M96632 Fracture of radius or ulna following insertion of orthopedic implant, joint prosthesis, or bone plate, left arm: Secondary | ICD-10-CM | POA: Diagnosis not present

## 2015-06-12 DIAGNOSIS — M545 Low back pain: Secondary | ICD-10-CM | POA: Diagnosis not present

## 2015-06-24 DIAGNOSIS — R262 Difficulty in walking, not elsewhere classified: Secondary | ICD-10-CM | POA: Diagnosis not present

## 2015-06-24 DIAGNOSIS — M6281 Muscle weakness (generalized): Secondary | ICD-10-CM | POA: Diagnosis not present

## 2015-06-27 DIAGNOSIS — J449 Chronic obstructive pulmonary disease, unspecified: Secondary | ICD-10-CM | POA: Diagnosis not present

## 2015-06-29 DIAGNOSIS — R9431 Abnormal electrocardiogram [ECG] [EKG]: Secondary | ICD-10-CM | POA: Diagnosis not present

## 2015-06-29 DIAGNOSIS — Z01818 Encounter for other preprocedural examination: Secondary | ICD-10-CM | POA: Diagnosis not present

## 2015-07-09 DIAGNOSIS — Z9981 Dependence on supplemental oxygen: Secondary | ICD-10-CM | POA: Diagnosis not present

## 2015-07-09 DIAGNOSIS — R0602 Shortness of breath: Secondary | ICD-10-CM | POA: Diagnosis not present

## 2015-07-09 DIAGNOSIS — J849 Interstitial pulmonary disease, unspecified: Secondary | ICD-10-CM | POA: Diagnosis not present

## 2015-07-13 DIAGNOSIS — M545 Low back pain: Secondary | ICD-10-CM | POA: Diagnosis not present

## 2015-07-25 DIAGNOSIS — R262 Difficulty in walking, not elsewhere classified: Secondary | ICD-10-CM | POA: Diagnosis not present

## 2015-07-25 DIAGNOSIS — M6281 Muscle weakness (generalized): Secondary | ICD-10-CM | POA: Diagnosis not present

## 2015-07-27 DIAGNOSIS — M25532 Pain in left wrist: Secondary | ICD-10-CM | POA: Diagnosis not present

## 2015-07-27 DIAGNOSIS — M25552 Pain in left hip: Secondary | ICD-10-CM | POA: Diagnosis not present

## 2015-07-28 DIAGNOSIS — J449 Chronic obstructive pulmonary disease, unspecified: Secondary | ICD-10-CM | POA: Diagnosis not present

## 2015-08-20 DIAGNOSIS — H5203 Hypermetropia, bilateral: Secondary | ICD-10-CM | POA: Diagnosis not present

## 2015-08-20 DIAGNOSIS — H52223 Regular astigmatism, bilateral: Secondary | ICD-10-CM | POA: Diagnosis not present

## 2015-08-20 DIAGNOSIS — H47292 Other optic atrophy, left eye: Secondary | ICD-10-CM | POA: Diagnosis not present

## 2015-08-20 DIAGNOSIS — H524 Presbyopia: Secondary | ICD-10-CM | POA: Diagnosis not present

## 2015-08-25 DIAGNOSIS — M6281 Muscle weakness (generalized): Secondary | ICD-10-CM | POA: Diagnosis not present

## 2015-08-25 DIAGNOSIS — R262 Difficulty in walking, not elsewhere classified: Secondary | ICD-10-CM | POA: Diagnosis not present

## 2015-08-28 DIAGNOSIS — J449 Chronic obstructive pulmonary disease, unspecified: Secondary | ICD-10-CM | POA: Diagnosis not present

## 2015-09-24 DIAGNOSIS — M6281 Muscle weakness (generalized): Secondary | ICD-10-CM | POA: Diagnosis not present

## 2015-09-24 DIAGNOSIS — R262 Difficulty in walking, not elsewhere classified: Secondary | ICD-10-CM | POA: Diagnosis not present

## 2015-09-27 DIAGNOSIS — J449 Chronic obstructive pulmonary disease, unspecified: Secondary | ICD-10-CM | POA: Diagnosis not present

## 2015-10-25 DIAGNOSIS — R262 Difficulty in walking, not elsewhere classified: Secondary | ICD-10-CM | POA: Diagnosis not present

## 2015-10-25 DIAGNOSIS — M6281 Muscle weakness (generalized): Secondary | ICD-10-CM | POA: Diagnosis not present

## 2015-10-28 DIAGNOSIS — J449 Chronic obstructive pulmonary disease, unspecified: Secondary | ICD-10-CM | POA: Diagnosis not present

## 2015-11-24 DIAGNOSIS — M6281 Muscle weakness (generalized): Secondary | ICD-10-CM | POA: Diagnosis not present

## 2015-11-24 DIAGNOSIS — R262 Difficulty in walking, not elsewhere classified: Secondary | ICD-10-CM | POA: Diagnosis not present

## 2015-11-27 DIAGNOSIS — J449 Chronic obstructive pulmonary disease, unspecified: Secondary | ICD-10-CM | POA: Diagnosis not present

## 2017-06-18 DEATH — deceased
